# Patient Record
Sex: Female | Born: 1990 | Race: Black or African American | Hispanic: No | Marital: Single | Smoking: Never smoker
Health system: Southern US, Community
[De-identification: ages and names within clinical notes are randomized; demographics above are authoritative.]

## PROBLEM LIST (undated history)

## (undated) DIAGNOSIS — Z789 Other specified health status: Secondary | ICD-10-CM

## (undated) HISTORY — DX: Other specified health status: Z78.9

## (undated) HISTORY — PX: WISDOM TOOTH EXTRACTION: SHX21

---

## 2013-10-07 ENCOUNTER — Emergency Department (HOSPITAL_COMMUNITY)
Admission: EM | Admit: 2013-10-07 | Discharge: 2013-10-07 | Disposition: A | Discharge status: Home / Self Care | Payer: No Typology Code available for payment source | Attending: Emergency Medicine | Admitting: Emergency Medicine

## 2013-10-07 ENCOUNTER — Encounter (HOSPITAL_COMMUNITY): Payer: Self-pay | Admitting: Emergency Medicine

## 2013-10-07 DIAGNOSIS — S6990XA Unspecified injury of unspecified wrist, hand and finger(s), initial encounter: Secondary | ICD-10-CM | POA: Insufficient documentation

## 2013-10-07 DIAGNOSIS — Y9389 Activity, other specified: Secondary | ICD-10-CM | POA: Insufficient documentation

## 2013-10-07 DIAGNOSIS — S0990XA Unspecified injury of head, initial encounter: Secondary | ICD-10-CM | POA: Insufficient documentation

## 2013-10-07 DIAGNOSIS — IMO0002 Reserved for concepts with insufficient information to code with codable children: Secondary | ICD-10-CM | POA: Insufficient documentation

## 2013-10-07 DIAGNOSIS — S0993XA Unspecified injury of face, initial encounter: Secondary | ICD-10-CM | POA: Insufficient documentation

## 2013-10-07 DIAGNOSIS — Y92410 Unspecified street and highway as the place of occurrence of the external cause: Secondary | ICD-10-CM | POA: Insufficient documentation

## 2013-10-07 DIAGNOSIS — S59919A Unspecified injury of unspecified forearm, initial encounter: Secondary | ICD-10-CM

## 2013-10-07 DIAGNOSIS — S59909A Unspecified injury of unspecified elbow, initial encounter: Secondary | ICD-10-CM | POA: Insufficient documentation

## 2013-10-07 DIAGNOSIS — S199XXA Unspecified injury of neck, initial encounter: Principal | ICD-10-CM

## 2013-10-07 MED ORDER — NAPROXEN 500 MG PO TABS: 500.0000 mg | ORAL_TABLET | Freq: Two times a day (BID) | ORAL | Status: DC
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
## 2013-10-11 NOTE — ED Provider Notes (Signed)
Medical screening examination/treatment/procedure(s) were performed by non-physician practitioner and as supervising physician I was immediately available for consultation/collaboration.   EKG Interpretation None       Kristof Nadeem R. Araly Kaas, MD 10/11/13 0032 

## 2016-10-01 ENCOUNTER — Encounter (HOSPITAL_BASED_OUTPATIENT_CLINIC_OR_DEPARTMENT_OTHER): Payer: Self-pay

## 2016-10-01 ENCOUNTER — Emergency Department (HOSPITAL_BASED_OUTPATIENT_CLINIC_OR_DEPARTMENT_OTHER)
Admission: EM | Admit: 2016-10-01 | Discharge: 2016-10-01 | Disposition: A | Discharge status: Home / Self Care | Payer: No Typology Code available for payment source | Attending: Emergency Medicine | Admitting: Emergency Medicine

## 2016-10-01 DIAGNOSIS — S299XXA Unspecified injury of thorax, initial encounter: Secondary | ICD-10-CM | POA: Diagnosis present

## 2016-10-01 DIAGNOSIS — Y92410 Unspecified street and highway as the place of occurrence of the external cause: Secondary | ICD-10-CM | POA: Diagnosis not present

## 2016-10-01 DIAGNOSIS — Y999 Unspecified external cause status: Secondary | ICD-10-CM | POA: Diagnosis not present

## 2016-10-01 DIAGNOSIS — M546 Pain in thoracic spine: Secondary | ICD-10-CM | POA: Insufficient documentation

## 2016-10-01 DIAGNOSIS — Y9389 Activity, other specified: Secondary | ICD-10-CM | POA: Insufficient documentation

## 2016-10-01 DIAGNOSIS — R030 Elevated blood-pressure reading, without diagnosis of hypertension: Secondary | ICD-10-CM

## 2016-10-01 MED ORDER — BACLOFEN 10 MG PO TABS: 10.0000 mg | ORAL_TABLET | Freq: Three times a day (TID) | ORAL | 0 refills | Status: DC

## 2016-10-01 MED ORDER — MELOXICAM 15 MG PO TABS: 15.0000 mg | ORAL_TABLET | Freq: Every day | ORAL | 0 refills | Status: DC

## 2016-10-01 MED FILL — BACLOFEN 10 MG TABLET: 10 | 10 days supply | Qty: 30 | Fill #0

## 2016-10-01 MED FILL — MELOXICAM 15 MG TABLET: 15 | 10 days supply | Qty: 10 | Fill #0
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
## 2016-10-01 NOTE — Discharge Instructions (Signed)
Have your blood pressure rechecked in 1 week.  Follow up with a primary care doctor if it continues to be elevated. Return to the emergency department immediately if you develop any of the following symptoms: You have numbness, tingling, or weakness in the arms or legs. You develop severe headaches not relieved with medicine. You have severe neck pain, especially tenderness in the middle of the back of your neck. You have changes in bowel or bladder control. There is increasing pain in any area of the body. You have shortness of breath, light-headedness, dizziness, or fainting. You have chest pain. You feel sick to your stomach (nauseous), throw up (vomit), or sweat. You have increasing abdominal discomfort. There is blood in your urine, stool, or vomit. You have pain in your shoulder (shoulder strap areas). You feel your symptoms are getting worse.

## 2017-04-06 ENCOUNTER — Emergency Department (HOSPITAL_COMMUNITY): Payer: 59

## 2017-04-06 ENCOUNTER — Encounter (HOSPITAL_COMMUNITY): Payer: Self-pay

## 2017-04-06 DIAGNOSIS — R1031 Right lower quadrant pain: Secondary | ICD-10-CM | POA: Diagnosis not present

## 2017-04-06 DIAGNOSIS — M7662 Achilles tendinitis, left leg: Secondary | ICD-10-CM | POA: Diagnosis not present

## 2017-04-06 DIAGNOSIS — M25572 Pain in left ankle and joints of left foot: Secondary | ICD-10-CM | POA: Diagnosis not present

## 2017-04-06 LAB — COMPREHENSIVE METABOLIC PANEL
ALT: 15 U/L (ref 14–54)
AST: 20 U/L (ref 15–41)
Albumin: 3.7 g/dL (ref 3.5–5.0)
Alkaline Phosphatase: 82 U/L (ref 38–126)
Anion gap: 7 (ref 5–15)
BUN: 14 mg/dL (ref 6–20)
CHLORIDE: 104 mmol/L (ref 101–111)
CO2: 25 mmol/L (ref 22–32)
Calcium: 9.2 mg/dL (ref 8.9–10.3)
Creatinine, Ser: 0.86 mg/dL (ref 0.44–1.00)
GFR calc Af Amer: >60 mL/min (ref >60)
GFR calc non Af Amer: >60 mL/min (ref >60)
Glucose, Bld: 79 mg/dL (ref 65–99)
POTASSIUM: 3.8 mmol/L (ref 3.5–5.1)
SODIUM: 136 mmol/L (ref 135–145)
Total Bilirubin: 0.3 mg/dL (ref 0.3–1.2)
Total Protein: 7.5 g/dL (ref 6.5–8.1)

## 2017-04-06 LAB — URINALYSIS, ROUTINE W REFLEX MICROSCOPIC
Bilirubin Urine: NEGATIVE
GLUCOSE, UA: NEGATIVE mg/dL
Ketones, ur: NEGATIVE mg/dL
NITRITE: NEGATIVE
PH: 6 (ref 5.0–8.0)
Protein, ur: NEGATIVE mg/dL
SPECIFIC GRAVITY, URINE: 1.021 (ref 1.005–1.030)

## 2017-04-06 LAB — CBC
HCT: 33.9 % — ABNORMAL LOW (ref 36.0–46.0)
Hemoglobin: 10.9 g/dL — ABNORMAL LOW (ref 12.0–15.0)
MCH: 26.7 pg (ref 26.0–34.0)
MCHC: 32.2 g/dL (ref 30.0–36.0)
MCV: 83.1 fL (ref 78.0–100.0)
Platelets: 339 10*3/uL (ref 150–400)
RBC: 4.08 MIL/uL (ref 3.87–5.11)
RDW: 13.7 % (ref 11.5–15.5)
WBC: 8.8 10*3/uL (ref 4.0–10.5)

## 2017-04-06 LAB — LIPASE, BLOOD: LIPASE: 26 U/L (ref 11–51)

## 2017-04-06 NOTE — ED Triage Notes (Signed)
Pt states that she has been having abd pain for the past six months, RUQ, sometimes nausea with loss of appetite. Also c/o of L foot pain that started on Saturday without any injury, no swelling noted.

## 2017-04-07 ENCOUNTER — Emergency Department (HOSPITAL_COMMUNITY)
Admission: EM | Admit: 2017-04-07 | Discharge: 2017-04-07 | Disposition: A | Discharge status: Home / Self Care | Payer: 59 | Attending: Emergency Medicine | Admitting: Emergency Medicine

## 2017-04-07 DIAGNOSIS — M7662 Achilles tendinitis, left leg: Secondary | ICD-10-CM

## 2017-04-07 DIAGNOSIS — R1031 Right lower quadrant pain: Secondary | ICD-10-CM

## 2017-04-07 MED ORDER — IBUPROFEN 600 MG PO TABS: 600.0000 mg | ORAL_TABLET | Freq: Four times a day (QID) | ORAL | 0 refills | Status: DC | PRN

## 2017-04-07 NOTE — Discharge Instructions (Signed)
Please follow up with gastroenterology  Rest - please stay off ankle as much as possible Ice - ice for 20 minutes at a time, several times a day Ibuprofen - take with food. Take up to 3-4 times daily

## 2017-04-07 NOTE — ED Provider Notes (Signed)
[facility]   CSN: [id number] Arrival date & time: 04/06/17  1833     History   Chief Complaint Chief Complaint  Patient presents with  . Abdominal Pain  . Ankle Pain    HPI Catherine Mccoy is a 26 y.o. female who presents with abdominal pain and left ankle pain.  No significant past medical history.  Patient states that she has had intermittent right lower quadrant pain for the past year.  It is worse when she is lifting heavy items.  She states she works in [employer] service and is on her feet for most of the day and this heavy objects frequently.  The pain gets better when she lies down.  She has been seen by previous providers before and they told her it was a muscle strain.  She was prescribed medicine which did not help the pain.  Over the past week the pain is worse that is why she has come to the ED today.  She denies any fever, chills, chest pain, shortness of breath, nausea, vomiting, diarrhea, constipation, urinary symptoms, vaginal bleeding or discharge.  No prior abdominal surgeries or colonoscopy.  She reports associated loss of appetite.  [place] [facility] [place]  Additionally she complains of left sided ankle pain.  She has had this for the past 2 days.  She denies any known injury.  The pain is over the posterior ankle.  It is constant and worse with walking.  She is ambulatory without any difficulty and does not have any swelling.  She has not tried any medicines prior to arrival.  HPI  History reviewed. No pertinent past medical history.  There are no active problems to display for this patient.   History reviewed. No pertinent surgical history.  OB History    No data available       Home Medications    Prior to Admission medications   Medication Sig Start Date End Date Taking? Authorizing Provider  baclofen (LIORESAL) 10 MG tablet Take 1 tablet (10 mg total) by mouth 3 (three) times daily. Patient not  taking: Reported on 04/06/2017 10/01/16   Arthor CaptainHarris, Abigail, PA-C  ibuprofen (ADVIL,MOTRIN) 600 MG tablet Take 1 tablet (600 mg total) every 6 (six) hours as needed by mouth. 04/07/17   Bethel BornGekas, Damon Baisch Marie, PA-C  meloxicam (MOBIC) 15 MG tablet Take 1 tablet (15 mg total) by mouth daily. Patient not taking: Reported on 04/06/2017 10/01/16   Arthor CaptainHarris, Abigail, PA-C    Family History No family history on file.  Social History Social History   Tobacco Use  . Smoking status: Never Smoker  . Smokeless tobacco: Never Used  Substance Use Topics  . Alcohol use: No  . Drug use: Not on file     Allergies   Patient has no known allergies.   Review of Systems Review of Systems  Constitutional: Positive for appetite change. Negative for chills and fever.  Respiratory: Negative for shortness of breath.   Cardiovascular: Negative for chest pain.  Gastrointestinal: Positive for abdominal pain. Negative for diarrhea, nausea and vomiting.  Genitourinary: Negative for dysuria.  Musculoskeletal: Positive for arthralgias. Negative for gait problem and joint swelling.  Neurological: Negative for weakness and numbness.  All other systems reviewed and are negative.    Physical Exam Updated Vital Signs BP 127/77 (BP Location: Right Arm)   Pulse 83   Temp 98.5 F (36.9 C) (Oral)   Resp 18   Ht 5\' 4"  (1.626  m)   Wt 89.8 kg (198 lb)   LMP 04/02/2017   SpO2 100%   BMI 33.99 kg/m   Physical Exam  Constitutional: She is oriented to person, place, and time. She appears well-developed and well-nourished. No distress.  HENT:  Head: Normocephalic and atraumatic.  Eyes: Conjunctivae are normal. Pupils are equal, round, and reactive to light. Right eye exhibits no discharge. Left eye exhibits no discharge. No scleral icterus.  Neck: Normal range of motion.  Cardiovascular: Normal rate and regular rhythm. Exam reveals no gallop and no friction rub.  No murmur heard. Pulmonary/Chest: Effort normal and breath  sounds normal. No stridor. No respiratory distress. She has no wheezes. She has no rales. She exhibits no tenderness.  Abdominal: Soft. Bowel sounds are normal. She exhibits no distension and no mass. There is no tenderness. There is no rebound and no guarding. No hernia.  No tenderness however patient points to right lower quadrant and states this is where it hurts when she has pain  Musculoskeletal:  Left ankle: No obvious swelling, deformity, or warmth. Tenderness to palpation of Achilles tendon. FROM. N/V intact.   Neurological: She is alert and oriented to person, place, and time.  Skin: Skin is warm and dry.  Psychiatric: She has a normal mood and affect. Her behavior is normal.  Nursing note and vitals reviewed.    ED Treatments / Results  Labs (all labs ordered are listed, but only abnormal results are displayed) Labs Reviewed  CBC - Abnormal; Notable for the following components:      Result Value   Hemoglobin 10.9 (*)    HCT 33.9 (*)    All other components within normal limits  URINALYSIS, ROUTINE W REFLEX MICROSCOPIC - Abnormal; Notable for the following components:   APPearance HAZY (*)    Hgb urine dipstick SMALL (*)    Leukocytes, UA LARGE (*)    Bacteria, UA MANY (*)    Squamous Epithelial / LPF 0-5 (*)    All other components within normal limits  LIPASE, BLOOD  COMPREHENSIVE METABOLIC PANEL    EKG  EKG Interpretation None       Radiology Dg Ankle Complete Left  Result Date: 04/06/2017 CLINICAL DATA:  Medial left ankle pain for 2 days. EXAM: LEFT ANKLE COMPLETE - 3+ VIEW COMPARISON:  None. FINDINGS: There is no evidence of fracture, dislocation, or joint effusion. There is no evidence of arthropathy or other focal bone abnormality. Soft tissues are unremarkable. IMPRESSION: Negative. Electronically Signed   By: Kennith Center M.D.   On: 04/06/2017 20:45    Procedures Procedures (including critical care time)  Medications Ordered in ED Medications - No  data to display   Initial Impression / Assessment and Plan / ED Course  I have reviewed the triage vital signs and the nursing notes.  Pertinent labs & imaging results that were available during my care of the patient were reviewed by me and considered in my medical decision making (see chart for details).  26 year old female with nonspecific right lower quadrant pain which has been ongoing for over a year.  Vital signs are normal.  Abdomen is soft and nontender.  Labs are remarkable for mild anemia.  Urinalysis shows small hemoglobin, leukocytes, many bacteria, too numerous to count white blood cells.  She denies any urinary symptoms at this time so we will hold off on any treatment.  Unclear etiology of pain.  Possibly a hernia or muscle strain since she has pain with lifting and  it is better with lying down.  She was given a referral to GI for ongoing pain.  Left ankle pain is consistent with Achilles tendinitis.  Advised rest, ice, NSAIDs, supportive shoes.  Work note was given and she was advised to follow-up with her doctor.  Final Clinical Impressions(s) / ED Diagnoses   Final diagnoses:  Right lower quadrant abdominal pain  Achilles tendinitis of left lower extremity    ED Discharge Orders        Ordered    ibuprofen (ADVIL,MOTRIN) 600 MG tablet  Every 6 hours PRN     04/07/17 0139       Bethel Born, PA-C 04/07/17 0445    Ward, Layla Maw, DO 04/07/17 [phone]

## 2017-04-20 ENCOUNTER — Other Ambulatory Visit: Payer: Self-pay | Admitting: Gastroenterology

## 2017-04-20 DIAGNOSIS — R1031 Right lower quadrant pain: Secondary | ICD-10-CM

## 2017-04-30 ENCOUNTER — Ambulatory Visit
Admission: RE | Admit: 2017-04-30 | Discharge: 2017-04-30 | Disposition: A | Discharge status: Home / Self Care | Payer: 59 | Source: Ambulatory Visit | Attending: Gastroenterology | Admitting: Gastroenterology

## 2017-04-30 DIAGNOSIS — R1031 Right lower quadrant pain: Secondary | ICD-10-CM

## 2017-04-30 MED ORDER — IOPAMIDOL (ISOVUE-300) INJECTION 61%
100.0000 mL | Freq: Once | INTRAVENOUS | Status: AC | PRN
  Administered 2017-04-30: 100 mL via INTRAVENOUS

## 2017-12-18 ENCOUNTER — Ambulatory Visit (INDEPENDENT_AMBULATORY_CARE_PROVIDER_SITE_OTHER): Payer: Managed Care, Other (non HMO) | Admitting: Family Medicine

## 2017-12-18 ENCOUNTER — Encounter: Payer: Self-pay | Admitting: Family Medicine

## 2017-12-18 ENCOUNTER — Other Ambulatory Visit: Payer: Self-pay

## 2017-12-18 VITALS — BP 102/80 | HR 89 | Temp 98.9°F | Ht 64.5 in | Wt 252.0 lb

## 2017-12-18 DIAGNOSIS — Z3169 Encounter for other general counseling and advice on procreation: Secondary | ICD-10-CM

## 2017-12-18 DIAGNOSIS — Z13220 Encounter for screening for lipoid disorders: Secondary | ICD-10-CM | POA: Diagnosis not present

## 2017-12-18 DIAGNOSIS — Z23 Encounter for immunization: Secondary | ICD-10-CM

## 2017-12-18 DIAGNOSIS — Z131 Encounter for screening for diabetes mellitus: Secondary | ICD-10-CM | POA: Diagnosis not present

## 2017-12-18 DIAGNOSIS — Z13228 Encounter for screening for other metabolic disorders: Secondary | ICD-10-CM

## 2017-12-18 DIAGNOSIS — Z0000 Encounter for general adult medical examination without abnormal findings: Secondary | ICD-10-CM

## 2017-12-18 DIAGNOSIS — Z9189 Other specified personal risk factors, not elsewhere classified: Secondary | ICD-10-CM

## 2017-12-18 DIAGNOSIS — Z1329 Encounter for screening for other suspected endocrine disorder: Secondary | ICD-10-CM

## 2017-12-18 NOTE — Patient Instructions (Addendum)
Hep B #3 and HPV #2 due in 2 months HPV #3 due in 6 months   IF you received an x-ray today, you will receive an invoice from [facility]. Please contact [facility] at [phone] with questions or concerns regarding your invoice.   IF you received labwork today, you will receive an invoice from [place]. Please contact [facility] at [phone] with questions or concerns regarding your invoice.   Our billing staff will not be able to assist you with questions regarding bills from these companies.  You will be contacted with the lab results as soon as they are available. The fastest way to get your results is to activate your My Chart account. Instructions are located on the last page of this paperwork. If you have not heard from [place] regarding the results in 2 weeks, please contact this office.     Preventive Care 18-39 Years, Female Preventive care refers to lifestyle choices and visits with your health care provider that can promote health and wellness. What does preventive care include?  A yearly physical exam. This is also called an annual well check.  Dental exams once or twice a year.  Routine eye exams. Ask your health care provider how often you should have your eyes checked.  Personal lifestyle choices, including: ? Daily care of your teeth and gums. ? Regular physical activity. ? Eating a healthy diet. ? Avoiding tobacco and drug use. ? Limiting alcohol use. ? Practicing safe sex. ? Taking vitamin and mineral supplements as recommended by your health care provider. What happens during an annual well check? The services and screenings done by your health care provider during your annual well check will depend on your age, overall health, lifestyle risk factors, and family history of disease. Counseling Your health care provider may ask you questions about your:  Alcohol use.  Tobacco use.  Drug use.  Emotional well-being.  Home and  relationship well-being.  Sexual activity.  Eating habits.  Work and work Statistician.  Method of birth control.  Menstrual cycle.  Pregnancy history.  Screening You may have the following tests or measurements:  Height, weight, and BMI.  Diabetes screening. This is done by checking your blood sugar (glucose) after you have not eaten for a while (fasting).  Blood pressure.  Lipid and cholesterol levels. These may be checked every 5 years starting at age 51.  Skin check.  Hepatitis C blood test.  Hepatitis B blood test.  Sexually transmitted disease (STD) testing.  BRCA-related cancer screening. This may be done if you have a family history of breast, ovarian, tubal, or peritoneal cancers.  Pelvic exam and Pap test. This may be done every 3 years starting at age 25. Starting at age 80, this may be done every 5 years if you have a Pap test in combination with an HPV test.  Discuss your test results, treatment options, and if necessary, the need for more tests with your health care provider. Vaccines Your health care provider may recommend certain vaccines, such as:  Influenza vaccine. This is recommended every year.  Tetanus, diphtheria, and acellular pertussis (Tdap, Td) vaccine. You may need a Td booster every 10 years.  Varicella vaccine. You may need this if you have not been vaccinated.  HPV vaccine. If you are 53 or younger, you may need three doses over 6 months.  Measles, mumps, and rubella (MMR) vaccine. You may need at least one dose of MMR. You may also need a second dose.  Pneumococcal 13-valent conjugate (PCV13) vaccine. You may need this if you have certain conditions and were not previously vaccinated.  Pneumococcal polysaccharide (PPSV23) vaccine. You may need one or two doses if you smoke cigarettes or if you have certain conditions.  Meningococcal vaccine. One dose is recommended if you are age 80-21 years and a first-year college student living  in a residence hall, or if you have one of several medical conditions. You may also need additional booster doses.  Hepatitis A vaccine. You may need this if you have certain conditions or if you travel or work in places where you may be exposed to hepatitis A.  Hepatitis B vaccine. You may need this if you have certain conditions or if you travel or work in places where you may be exposed to hepatitis B.  Haemophilus influenzae type b (Hib) vaccine. You may need this if you have certain risk factors.  Talk to your health care provider about which screenings and vaccines you need and how often you need them. This information is not intended to replace advice given to you by your health care provider. Make sure you discuss any questions you have with your health care provider. Document Released: 07/15/2001 Document Revised: 02/06/2016 Document Reviewed: 03/20/2015 Elsevier Interactive Patient Education  [employer].

## 2017-12-18 NOTE — Progress Notes (Signed)
7/19/20194:25 PM  Catherine Mccoy 10-Apr-1991, 27 y.o. female [id number]  Chief Complaint  Patient presents with  . New Patient (Initial Visit)    having no symptoms today, just looking for PCP    HPI:   Patient is a 27 y.o. 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 No     Depression screen PHQ 2/9 12/18/2017  Decreased Interest 0  Down, Depressed, Hopeless 0  PHQ - 2 Score 0    No Known Allergies  Prior to Admission medications   Not on File    History reviewed. No pertinent past medical history.  History reviewed. No pertinent surgical history.  Social History   Tobacco Use  . Smoking status: Never Smoker  . Smokeless tobacco: Never Used  Substance Use Topics  . Alcohol use: No    Family History  Problem Relation Age of Onset  . Hyperlipidemia Mother   . Healthy Mother   . Hyperlipidemia Father   . Healthy Father   . Healthy Sister   . Healthy Brother     Review of Systems  Constitutional: Negative for chills and fever.  Respiratory: Negative for cough and shortness of breath.   Cardiovascular: Negative for chest pain, palpitations and leg swelling.  Gastrointestinal: Negative for abdominal pain, nausea and vomiting.  Genitourinary: Negative for dysuria and hematuria.       Neg breast lumps or nipple discharge Neg vaginal discharge, pelvic pain, dyspareunia, abnormal vaginal bleeding  Musculoskeletal: Negative for falls and  myalgias.  Neurological: Negative for dizziness and headaches.  Psychiatric/Behavioral: Negative for depression. The patient is not nervous/anxious and does not have insomnia.   All other systems reviewed and are negative.    OBJECTIVE:  Blood pressure 102/80, pulse 89, temperature 98.9 F (37.2 C), temperature source Oral, height 5' 4.5" (1.638 m), weight 252 lb (114.3 kg), last menstrual period 11/23/2017, SpO2 100 %. Body mass index is 42.59 kg/m.  Physical Exam  Constitutional: She is oriented to person, place, and time. She appears well-developed and well-nourished.  HENT:  Head: Normocephalic and atraumatic.  Right Ear: Hearing, tympanic membrane, external ear and ear canal normal.  Left Ear: Hearing, tympanic membrane, external ear and ear canal normal.  Mouth/Throat: Oropharynx is clear and moist.  Eyes: Pupils are equal, round, and reactive to light. Conjunctivae and EOM are normal.  Neck: Neck supple. No thyromegaly present.  Cardiovascular: Normal rate, regular rhythm, normal heart sounds and intact distal pulses. Exam reveals no gallop and no friction rub.  No murmur heard. Pulmonary/Chest: Effort normal and breath sounds normal. She has no wheezes. She has no rales.  Abdominal: Soft. Bowel sounds are normal. She exhibits no distension and no mass. There is no tenderness.  Musculoskeletal: Normal range of motion. She exhibits no edema.  Lymphadenopathy:    She has no cervical adenopathy.  Neurological: She is alert and oriented to person, place, and time. She has normal reflexes. No cranial nerve deficit. Gait normal.  Skin: Skin  is warm and dry.  Psychiatric: She has a normal mood and affect.  Nursing note and vitals reviewed.   ASSESSMENT and PLAN  1. Annual physical exam Routine HCM labs ordered. HCM reviewed/discussed. Anticipatory guidance regarding healthy weight, lifestyle and choices given.   2. Need for vaccination Hep B #3 and HPV #2 due in 2  months HPV #3 due in 6 months  - HPV 9-valent vaccine,Recombinat - Hepatitis B vaccine adult IM  3. Screening for lipid disorders - Lipid panel  4. Screening for diabetes mellitus - Hemoglobin A1c  5. Screening for metabolic disorder - QMV78+IONG  6. Screening for thyroid disorder - TSH  7. Pre-conception counseling 8. At risk for fertility problems - Ambulatory referral to Obstetrics / Gynecology   Return for after fertility eval. will need pap if not done by specialist    Rutherford Guys, MD Primary Care at [facility], [place] [address] Ph.  [phone] Fax [phone]

## 2017-12-19 LAB — LIPID PANEL
Chol/HDL Ratio: 4.1 ratio (ref 0.0–4.4)
Cholesterol, Total: 171 mg/dL (ref 100–199)
HDL: 42 mg/dL (ref >39)
LDL Calculated: 117 mg/dL — ABNORMAL HIGH (ref 0–99)
Triglycerides: 59 mg/dL (ref 0–149)
VLDL Cholesterol Cal: 12 mg/dL (ref 5–40)

## 2017-12-19 LAB — CMP14+EGFR
ALT: 16 IU/L (ref 0–32)
AST: 17 IU/L (ref 0–40)
Albumin/Globulin Ratio: 1.2 (ref 1.2–2.2)
Albumin: 4.2 g/dL (ref 3.5–5.5)
Alkaline Phosphatase: 94 IU/L (ref 39–117)
BUN/Creatinine Ratio: 13 (ref 9–23)
BUN: 10 mg/dL (ref 6–20)
Bilirubin Total: <0.2 mg/dL (ref 0.0–1.2)
CO2: 24 mmol/L (ref 20–29)
Calcium: 9.4 mg/dL (ref 8.7–10.2)
Chloride: 102 mmol/L (ref 96–106)
Creatinine, Ser: 0.8 mg/dL (ref 0.57–1.00)
GFR calc Af Amer: 116 mL/min/{1.73_m2} (ref >59)
GFR calc non Af Amer: 101 mL/min/{1.73_m2} (ref >59)
Globulin, Total: 3.4 g/dL (ref 1.5–4.5)
Glucose: 79 mg/dL (ref 65–99)
Potassium: 4.4 mmol/L (ref 3.5–5.2)
Sodium: 138 mmol/L (ref 134–144)
Total Protein: 7.6 g/dL (ref 6.0–8.5)

## 2017-12-19 LAB — TSH: TSH: 2.5 u[IU]/mL (ref 0.450–4.500)

## 2017-12-19 LAB — HEMOGLOBIN A1C
Est. average glucose Bld gHb Est-mCnc: 114 mg/dL
Hgb A1c MFr Bld: 5.6 % (ref 4.8–5.6)

## 2018-12-21 ENCOUNTER — Encounter: Payer: Self-pay | Admitting: Family Medicine

## 2018-12-24 ENCOUNTER — Telehealth: Payer: BLUE CROSS/BLUE SHIELD | Admitting: Family

## 2018-12-24 DIAGNOSIS — B373 Candidiasis of vulva and vagina: Secondary | ICD-10-CM

## 2018-12-24 DIAGNOSIS — B3731 Acute candidiasis of vulva and vagina: Secondary | ICD-10-CM

## 2018-12-24 MED ORDER — FLUCONAZOLE 150 MG PO TABS: 150.0000 mg | ORAL_TABLET | Freq: Once | ORAL | 0 refills | Status: AC

## 2018-12-24 NOTE — Progress Notes (Signed)
Greater than 5 minutes, yet less than 10 minutes of time have been spent researching, coordinating, and implementing care for this patient today.  Thank you for the details you included in the comment boxes. Those details are very helpful in determining the best course of treatment for you and help us to provide the best care.  We are sorry that you are not feeling well. Here is how we plan to help! Based on what you shared with me it looks like you: May have a yeast vaginosis  Vaginosis is an inflammation of the vagina that can result in discharge, itching and pain. The cause is usually a change in the normal balance of vaginal bacteria or an infection. Vaginosis can also result from reduced estrogen levels after menopause.  The most common causes of vaginosis are:   Bacterial vaginosis which results from an overgrowth of one on several organisms that are normally present in your vagina.   Yeast infections which are caused by a naturally occurring fungus called candida.   Vaginal atrophy (atrophic vaginosis) which results from the thinning of the vagina from reduced estrogen levels after menopause.   Trichomoniasis which is caused by a parasite and is commonly transmitted by sexual intercourse.  Factors that increase your risk of developing vaginosis include: . Medications, such as antibiotics and steroids . Uncontrolled diabetes . Use of hygiene products such as bubble bath, vaginal spray or vaginal deodorant . Douching . Wearing damp or tight-fitting clothing . Using an intrauterine device (IUD) for birth control . Hormonal changes, such as those associated with pregnancy, birth control pills or menopause . Sexual activity . Having a sexually transmitted infection  Your treatment plan is A single Diflucan (fluconazole) 150mg tablet once.  I have electronically sent this prescription into the pharmacy that you have chosen.  Be sure to take all of the medication as directed. Stop  taking any medication if you develop a rash, tongue swelling or shortness of breath. Mothers who are breast feeding should consider pumping and discarding their breast milk while on these antibiotics. However, there is no consensus that infant exposure at these doses would be harmful.  Remember that medication creams can weaken latex condoms. .   HOME CARE:  Good hygiene may prevent some types of vaginosis from recurring and may relieve some symptoms:  . Avoid baths, hot tubs and whirlpool spas. Rinse soap from your outer genital area after a shower, and dry the area well to prevent irritation. Don't use scented or harsh soaps, such as those with deodorant or antibacterial action. . Avoid irritants. These include scented tampons and pads. . Wipe from front to back after using the toilet. Doing so avoids spreading fecal bacteria to your vagina.  Other things that may help prevent vaginosis include:  . Don't douche. Your vagina doesn't require cleansing other than normal bathing. Repetitive douching disrupts the normal organisms that reside in the vagina and can actually increase your risk of vaginal infection. Douching won't clear up a vaginal infection. . Use a latex condom. Both female and female latex condoms may help you avoid infections spread by sexual contact. . Wear cotton underwear. Also wear pantyhose with a cotton crotch. If you feel comfortable without it, skip wearing underwear to bed. Yeast thrives in moist environments Your symptoms should improve in the next day or two.  GET HELP RIGHT AWAY IF:  . You have pain in your lower abdomen ( pelvic area or over your ovaries) . You develop nausea   or vomiting . You develop a fever . Your discharge changes or worsens . You have persistent pain with intercourse . You develop shortness of breath, a rapid pulse, or you faint.  These symptoms could be signs of problems or infections that need to be evaluated by a medical provider  now.  MAKE SURE YOU    Understand these instructions.  Will watch your condition.  Will get help right away if you are not doing well or get worse.  Your e-visit answers were reviewed by a board certified advanced clinical practitioner to complete your personal care plan. Depending upon the condition, your plan could have included both over the counter or prescription medications. Please review your pharmacy choice to make sure that you have choses a pharmacy that is open for you to pick up any needed prescription, Your safety is important to us. If you have drug allergies check your prescription carefully.   You can use MyChart to ask questions about today's visit, request a non-urgent call back, or ask for a work or school excuse for 24 hours related to this e-Visit. If it has been greater than 24 hours you will need to follow up with your provider, or enter a new e-Visit to address those concerns. You will get a MyChart message within the next two days asking about your experience. I hope that your e-visit has been valuable and will speed your recovery.  

## 2019-01-09 DIAGNOSIS — Z1159 Encounter for screening for other viral diseases: Secondary | ICD-10-CM | POA: Diagnosis not present

## 2019-01-10 ENCOUNTER — Telehealth: Payer: Self-pay | Admitting: *Deleted

## 2019-01-10 ENCOUNTER — Telehealth: Payer: BLUE CROSS/BLUE SHIELD | Admitting: Emergency Medicine

## 2019-01-10 ENCOUNTER — Encounter: Payer: Self-pay | Admitting: Emergency Medicine

## 2019-01-10 DIAGNOSIS — Z20828 Contact with and (suspected) exposure to other viral communicable diseases: Secondary | ICD-10-CM

## 2019-01-10 DIAGNOSIS — Z20822 Contact with and (suspected) exposure to covid-19: Secondary | ICD-10-CM

## 2019-01-10 NOTE — Progress Notes (Signed)
Telemedicine Encounter- SOAP NOTE Established Patient  This telephone encounter was conducted with the patient's (or proxy's) verbal consent via audio telecommunications: yes/no: Yes Patient was instructed to have this encounter in a suitably private space; and to only have persons present to whom they give permission to participate. In addition, patient identity was confirmed by use of name plus two identifiers (DOB and address).  I discussed the limitations, risks, security and privacy concerns of performing an evaluation and management service by telephone and the availability of in person appointments. I also discussed with the patient that there may be a patient responsible charge related to this service. The patient expressed understanding and agreed to proceed.  I spent a total of TIME; 0 MIN TO 60 MIN: 5 minutes talking with the patient or their proxy.  No chief complaint on file. COVID exposure  Subjective   Catherine Mccoy is a 28 y.o. female established patient. Telephone visit today for possible COVID testing.  Patient states that she was exposed and wanted to be tested.  However she was already tested yesterday and wanted to cancel this appointment.  She tested negative.  Asymptomatic.  Has no other concerns or questions today.  HPI   There are no active problems to display for this patient.   History reviewed. No pertinent past medical history.  No current outpatient medications on file.   No current facility-administered medications for this visit.     No Known Allergies  Social History   Socioeconomic History  . Marital status: Single    Spouse name: Not on file  . Number of children: Not on file  . Years of education: Not on file  . Highest education level: Not on file  Occupational History  . Not on file  Social Needs  . Financial resource strain: Not on file  . Food insecurity    Worry: Not on file    Inability: Not on file  . Transportation needs   Medical: Not on file    Non-medical: Not on file  Tobacco Use  . Smoking status: Never Smoker  . Smokeless tobacco: Never Used  Substance and Sexual Activity  . Alcohol use: No  . Drug use: Never  . Sexual activity: Never    Birth control/protection: None  Lifestyle  . Physical activity    Days per week: Not on file    Minutes per session: Not on file  . Stress: Not on file  Relationships  . Social Musicianconnections    Talks on phone: Not on file    Gets together: Not on file    Attends religious service: Not on file    Active member of club or organization: Not on file    Attends meetings of clubs or organizations: Not on file    Relationship status: Not on file  . Intimate partner violence    Fear of current or ex partner: Not on file    Emotionally abused: Not on file    Physically abused: Not on file    Forced sexual activity: Not on file  Other Topics Concern  . Not on file  Social History Narrative  . Not on file    ROS  Objective  Alert and oriented x3 in no apparent respiratory distress Vitals as reported by the patient: There were no vitals filed for this visit.  There are no diagnoses linked to this encounter. Diagnoses and all orders for this visit:  Exposure to Covid-19 Virus  I discussed the assessment and treatment plan with the patient. The patient was provided an opportunity to ask questions and all were answered. The patient agreed with the plan and demonstrated an understanding of the instructions.   The patient was advised to call back or seek an in-person evaluation if the symptoms worsen or if the condition fails to improve as anticipated.  I provided 5 minutes of non-face-to-face time during this encounter.  Horald Pollen, MD  Primary Care at [facility]

## 2019-01-10 NOTE — Telephone Encounter (Signed)
Called patient to triage for appointment. Patient answered the phone, her voice was very low and I identified myself as calling from Primary Care at [facility]. The patient hung up and I called back without her answering, I left message in mobile voice mail. I called patient again at 5:01, I left message in mobile voice mail again.

## 2019-01-24 ENCOUNTER — Ambulatory Visit (INDEPENDENT_AMBULATORY_CARE_PROVIDER_SITE_OTHER): Payer: BLUE CROSS/BLUE SHIELD | Admitting: Family Medicine

## 2019-01-24 ENCOUNTER — Other Ambulatory Visit: Payer: Self-pay

## 2019-01-24 ENCOUNTER — Encounter: Payer: Self-pay | Admitting: Family Medicine

## 2019-01-24 ENCOUNTER — Encounter: Payer: BLUE CROSS/BLUE SHIELD | Admitting: Family Medicine

## 2019-01-24 VITALS — BP 121/82 | HR 98 | Temp 98.1°F | Ht 64.5 in | Wt 263.2 lb

## 2019-01-24 DIAGNOSIS — Z13220 Encounter for screening for lipoid disorders: Secondary | ICD-10-CM

## 2019-01-24 DIAGNOSIS — Z0000 Encounter for general adult medical examination without abnormal findings: Secondary | ICD-10-CM

## 2019-01-24 DIAGNOSIS — Z23 Encounter for immunization: Secondary | ICD-10-CM | POA: Diagnosis not present

## 2019-01-24 DIAGNOSIS — Z131 Encounter for screening for diabetes mellitus: Secondary | ICD-10-CM

## 2019-01-24 DIAGNOSIS — Z114 Encounter for screening for human immunodeficiency virus [HIV]: Secondary | ICD-10-CM | POA: Diagnosis not present

## 2019-01-24 NOTE — Progress Notes (Signed)
8/24/20204:52 PM  Catherine Mccoy 02/10/1991, 28 y.o., female [id number]  Chief Complaint  Patient presents with  . Gynecologic Exam  . Annual Exam    HPI:   Patient is a 28 y.o. female who presents today for CPE  G&Ps: 0 Pap: none, has never been sexually active STD: none BC : none Menses: regular, last about 4-5 days, heavy first 2, not painful Mammogram: none FHX breast/ovarian cancer: none FHx colon cancer: none Exercise/diet: trying to exercise 3-4 x week  Will be getting married next summer  Most Recent Immunizations  Administered Date(s) Administered  . HPV 9-valent 12/18/2017  . Hepatitis B, adult 12/18/2017    Depression screen [facility] 2/9 01/24/2019 01/24/2019 12/18/2017  Decreased Interest 0 0 0  Down, Depressed, Hopeless 0 0 0  PHQ - 2 Score 0 0 0    Fall Risk  01/24/2019 01/24/2019 12/18/2017  Falls in the past year? 0 0 No  Number falls in past yr: 0 0 -  Injury with Fall? 0 0 -     No Known Allergies  Prior to Admission medications   Not on File    History reviewed. No pertinent past medical history.  History reviewed. No pertinent surgical history.  Social History   Tobacco Use  . Smoking status: Never Smoker  . Smokeless tobacco: Never Used  Substance Use Topics  . Alcohol use: No    Family History  Problem Relation Age of Onset  . Hyperlipidemia Mother   . Healthy Mother   . Hyperlipidemia Father   . Healthy Father   . Healthy Sister   . Healthy Brother     Review of Systems  Constitutional: Negative for chills and fever.  Respiratory: Negative for cough and shortness of breath.   Cardiovascular: Negative for chest pain, palpitations and leg swelling.  Gastrointestinal: Negative for abdominal pain, nausea and vomiting.  All other systems reviewed and are negative.    OBJECTIVE:  Today's Vitals   01/24/19 1637  BP: 121/82  Pulse: 98  Temp: 98.1 F (36.7 C)  TempSrc: Oral  SpO2: 100%  Weight: 263 lb 3.2 oz (119.4 kg)   Height: 5' 4.5" (1.638 m)   Body mass index is 44.48 kg/m.   Physical Exam Vitals signs and nursing note reviewed. Exam conducted with a chaperone present.  Constitutional:      Appearance: She is well-developed.  HENT:     Head: Normocephalic and atraumatic.     Right Ear: Hearing, tympanic membrane, ear canal and external ear normal.     Left Ear: Hearing, tympanic membrane, ear canal and external ear normal.     Mouth/Throat:     Mouth: Mucous membranes are moist.     Pharynx: No oropharyngeal exudate or posterior oropharyngeal erythema.  Eyes:     Extraocular Movements: Extraocular movements intact.     Conjunctiva/sclera: Conjunctivae normal.     Pupils: Pupils are equal, round, and reactive to light.  Neck:     Musculoskeletal: Neck supple.     Thyroid: No thyromegaly.  Cardiovascular:     Rate and Rhythm: Normal rate and regular rhythm.     Heart sounds: Normal heart sounds. No murmur. No friction rub. No gallop.   Pulmonary:     Effort: Pulmonary effort is normal.     Breath sounds: Normal breath sounds. No wheezing, rhonchi or rales.  Chest:     Breasts:        Right: No mass, nipple discharge, skin change or  tenderness.        Left: No mass, nipple discharge, skin change or tenderness.  Abdominal:     General: Bowel sounds are normal. There is no distension.     Palpations: Abdomen is soft. There is no hepatomegaly, splenomegaly or mass.     Tenderness: There is no abdominal tenderness.  Musculoskeletal: Normal range of motion.     Right lower leg: No edema.     Left lower leg: No edema.  Lymphadenopathy:     Cervical: No cervical adenopathy.     Upper Body:     Right upper body: No supraclavicular, axillary or pectoral adenopathy.     Left upper body: No supraclavicular, axillary or pectoral adenopathy.  Skin:    General: Skin is warm and dry.  Neurological:     Mental Status: She is alert and oriented to person, place, and time.     Cranial Nerves: No  cranial nerve deficit.     Gait: Gait normal.     Deep Tendon Reflexes: Reflexes are normal and symmetric.  Psychiatric:        Mood and Affect: Mood normal.        Behavior: Behavior normal.     No results found for this or any previous visit (from the past 24 hour(s)).  No results found.   ASSESSMENT and PLAN  1. Annual physical exam Routine HCM labs ordered. HCM reviewed/discussed. Anticipatory guidance regarding healthy weight, lifestyle and choices given.   2. Screening for HIV (human immunodeficiency virus)  3. Need for vaccination - Td vaccine greater than or equal to 7yo preservative free IM  4. Screening for lipid disorders - Lipid panel  5. Screening for diabetes mellitus - Hemoglobin A1c  Other orders - Hepatitis B vaccine adult IM - HPV 9-valent vaccine,Recombinat  Return in about 1 year (around 01/24/2020).    Myles LippsIrma M Santiago, MD Primary Care at [facility] [place] [address] [place], [place] [address] Ph.  [phone] Fax [phone]

## 2019-01-24 NOTE — Patient Instructions (Addendum)
Last HPV vaccine due in 6 months   Preventive Care 24-29 Years Old, Female Preventive care refers to visits with your health care provider and lifestyle choices that can promote health and wellness. This includes:  A yearly physical exam. This may also be called an annual well check.  Regular dental visits and eye exams.  Immunizations.  Screening for certain conditions.  Healthy lifestyle choices, such as eating a healthy diet, getting regular exercise, not using drugs or products that contain nicotine and tobacco, and limiting alcohol use. What can I expect for my preventive care visit? Physical exam Your health care provider will check your:  Height and weight. This may be used to calculate body mass index (BMI), which tells if you are at a healthy weight.  Heart rate and blood pressure.  Skin for abnormal spots. Counseling Your health care provider may ask you questions about your:  Alcohol, tobacco, and drug use.  Emotional well-being.  Home and relationship well-being.  Sexual activity.  Eating habits.  Work and work Statistician.  Method of birth control.  Menstrual cycle.  Pregnancy history. What immunizations do I need?  Influenza (flu) vaccine  This is recommended every year. Tetanus, diphtheria, and pertussis (Tdap) vaccine  You may need a Td booster every 10 years. Varicella (chickenpox) vaccine  You may need this if you have not been vaccinated. Human papillomavirus (HPV) vaccine  If recommended by your health care provider, you may need three doses over 6 months. Measles, mumps, and rubella (MMR) vaccine  You may need at least one dose of MMR. You may also need a second dose. Meningococcal conjugate (MenACWY) vaccine  One dose is recommended if you are age 74-21 years and a first-year college student living in a residence hall, or if you have one of several medical conditions. You may also need additional booster doses. Pneumococcal  conjugate (PCV13) vaccine  You may need this if you have certain conditions and were not previously vaccinated. Pneumococcal polysaccharide (PPSV23) vaccine  You may need one or two doses if you smoke cigarettes or if you have certain conditions. Hepatitis A vaccine  You may need this if you have certain conditions or if you travel or work in places where you may be exposed to hepatitis A. Hepatitis B vaccine  You may need this if you have certain conditions or if you travel or work in places where you may be exposed to hepatitis B. Haemophilus influenzae type b (Hib) vaccine  You may need this if you have certain conditions. You may receive vaccines as individual doses or as more than one vaccine together in one shot (combination vaccines). Talk with your health care provider about the risks and benefits of combination vaccines. What tests do I need?  Blood tests  Lipid and cholesterol levels. These may be checked every 5 years starting at age 54.  Hepatitis C test.  Hepatitis B test. Screening  Diabetes screening. This is done by checking your blood sugar (glucose) after you have not eaten for a while (fasting).  Sexually transmitted disease (STD) testing.  BRCA-related cancer screening. This may be done if you have a family history of breast, ovarian, tubal, or peritoneal cancers.  Pelvic exam and Pap test. This may be done every 3 years starting at age 38. Starting at age 74, this may be done every 5 years if you have a Pap test in combination with an HPV test. Talk with your health care provider about your test  results, treatment options, and if necessary, the need for more tests. Follow these instructions at home: Eating and drinking   Eat a diet that includes fresh fruits and vegetables, whole grains, lean protein, and low-fat dairy.  Take vitamin and mineral supplements as recommended by your health care provider.  Do not drink alcohol if: ? Your health care  provider tells you not to drink. ? You are pregnant, may be pregnant, or are planning to become pregnant.  If you drink alcohol: ? Limit how much you have to 0-1 drink a day. ? Be aware of how much alcohol is in your drink. In the [place] one drink equals one 12 oz bottle of beer (355 mL), one 5 oz glass of wine (148 mL), or one 1 oz glass of hard liquor (44 mL). Lifestyle  Take daily care of your teeth and gums.  Stay active. Exercise for at least 30 minutes on 5 or more days each week.  Do not use any products that contain nicotine or tobacco, such as cigarettes, e-cigarettes, and chewing tobacco. If you need help quitting, ask your health care provider.  If you are sexually active, practice safe sex. Use a condom or other form of birth control (contraception) in order to prevent pregnancy and STIs (sexually transmitted infections). If you plan to become pregnant, see your health care provider for a preconception visit. What's next?  Visit your health care provider once a year for a well check visit.  Ask your health care provider how often you should have your eyes and teeth checked.  Stay up to date on all vaccines. This information is not intended to replace advice given to you by your health care provider. Make sure you discuss any questions you have with your health care provider. Document Released: 07/15/2001 Document Revised: 01/28/2018 Document Reviewed: 01/28/2018 Elsevier Patient Education  [employer].   If you have lab work done today you will be contacted with your lab results within the next 2 weeks.  If you have not heard from [place] then please contact us. The fastest way to get your results is to register for My Chart.   IF you received an x-ray today, you will receive an invoice from [facility]. Please contact [facility] at [phone] with questions or concerns regarding your invoice.   IF you received labwork today, you will receive an  invoice from [facility]. Please contact [facility] at [phone] with questions or concerns regarding your invoice.   Our billing staff will not be able to assist you with questions regarding bills from these companies.  You will be contacted with the lab results as soon as they are available. The fastest way to get your results is to activate your My Chart account. Instructions are located on the last page of this paperwork. If you have not heard from [place] regarding the results in 2 weeks, please contact this office.

## 2019-01-25 LAB — LIPID PANEL
Chol/HDL Ratio: 3.8 ratio (ref 0.0–4.4)
Cholesterol, Total: 190 mg/dL (ref 100–199)
HDL: 50 mg/dL (ref >39)
LDL Calculated: 126 mg/dL — ABNORMAL HIGH (ref 0–99)
Triglycerides: 69 mg/dL (ref 0–149)
VLDL Cholesterol Cal: 14 mg/dL (ref 5–40)

## 2019-01-25 LAB — HEMOGLOBIN A1C
Est. average glucose Bld gHb Est-mCnc: 117 mg/dL
Hgb A1c MFr Bld: 5.7 % — ABNORMAL HIGH (ref 4.8–5.6)

## 2019-02-10 ENCOUNTER — Encounter: Payer: Self-pay | Admitting: Family Medicine

## 2019-02-14 DIAGNOSIS — Z01419 Encounter for gynecological examination (general) (routine) without abnormal findings: Secondary | ICD-10-CM | POA: Diagnosis not present

## 2019-02-14 DIAGNOSIS — N760 Acute vaginitis: Secondary | ICD-10-CM | POA: Diagnosis not present

## 2019-02-15 IMAGING — CR DG ANKLE COMPLETE 3+V*L*
3 series · 3 of 3 positions shown · non-contrast
Comparison: None.

CLINICAL DATA: Medial left ankle pain for 2 days.

EXAM:
LEFT ANKLE COMPLETE - 3+ VIEW

[ankle ap]
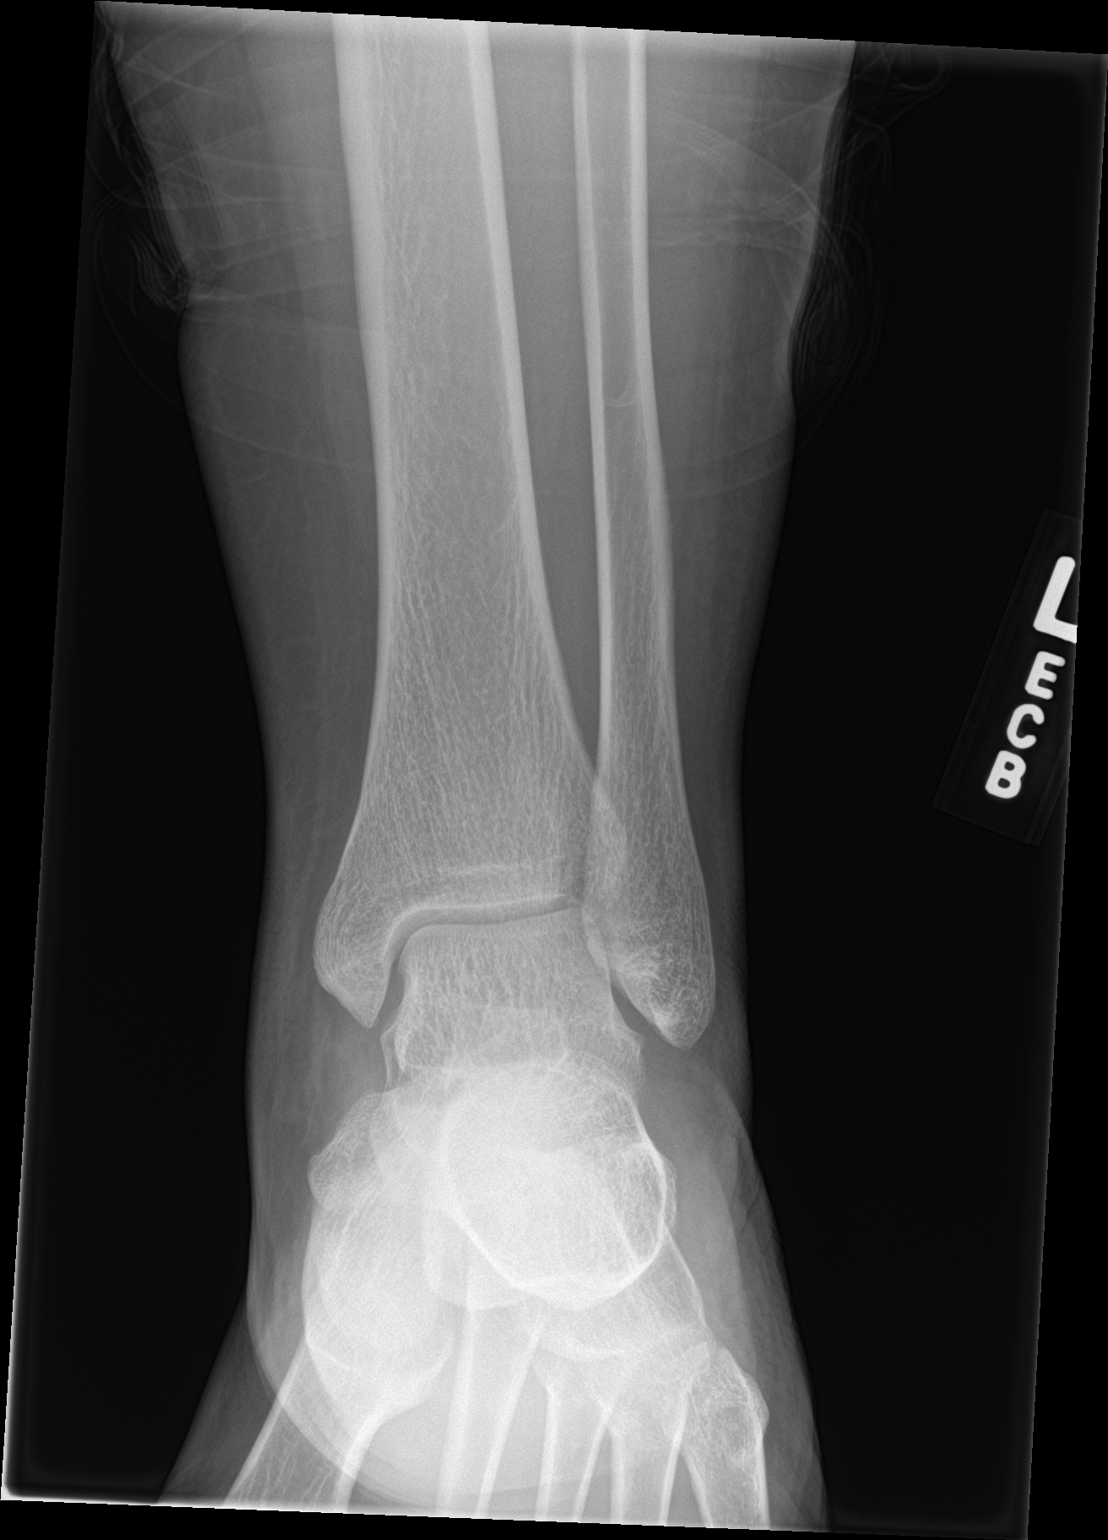

[ankle obl]
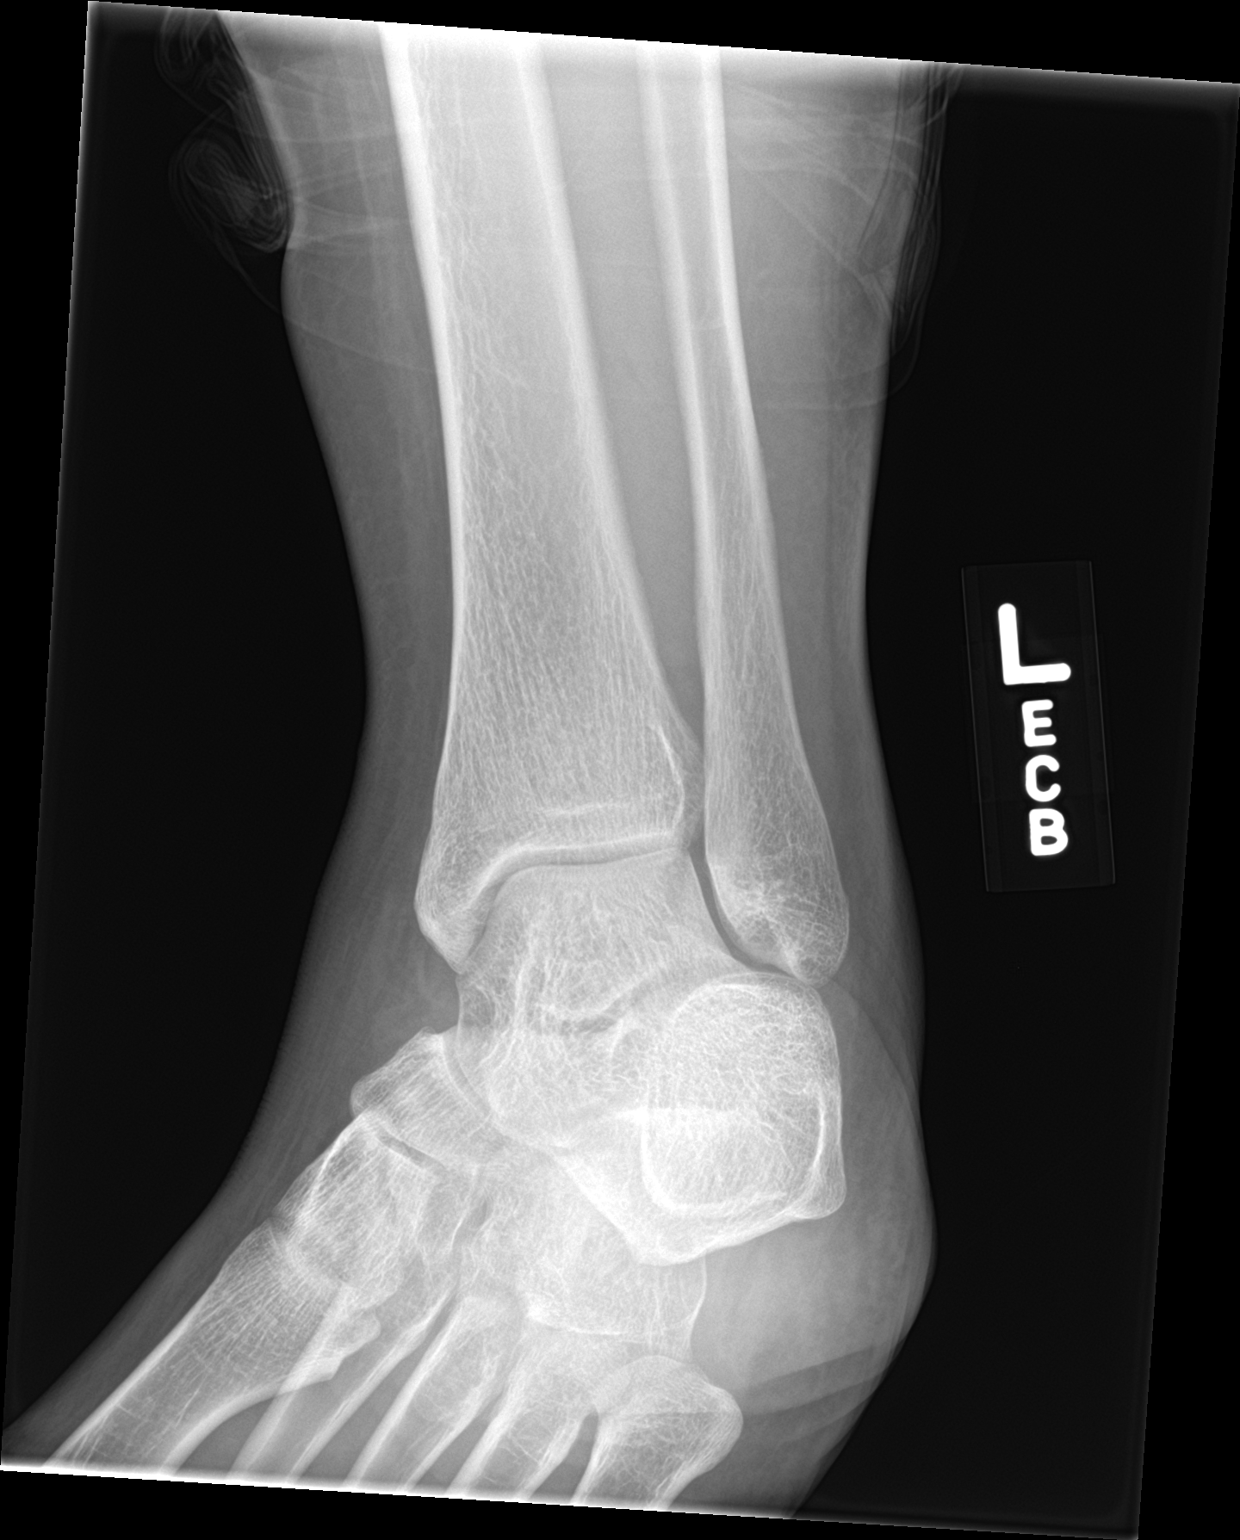

[ankle lat]
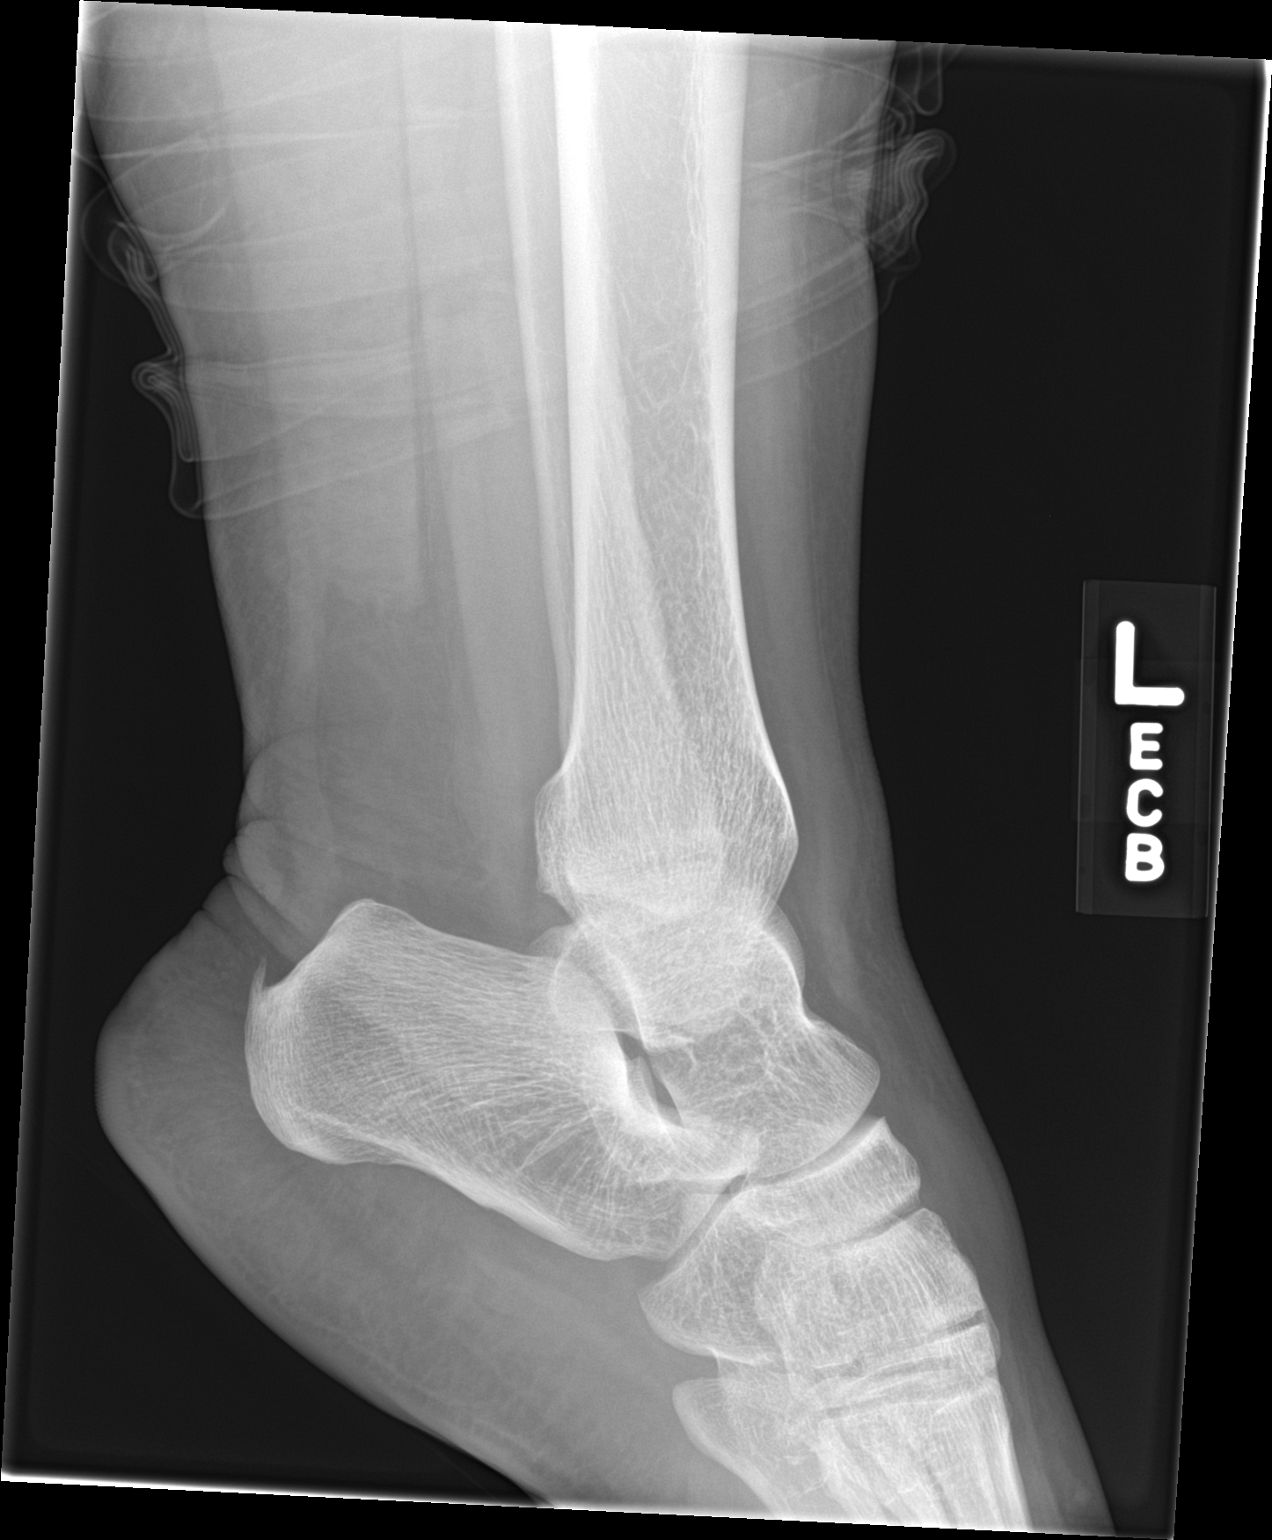

[3 of 3 positions shown; findings below may reference images not displayed]

FINDINGS: There is no evidence of fracture, dislocation, or joint effusion.
There is no evidence of arthropathy or other focal bone abnormality.
Soft tissues are unremarkable.
IMPRESSION: Negative.

## 2019-03-18 ENCOUNTER — Ambulatory Visit: Payer: BLUE CROSS/BLUE SHIELD | Admitting: Family Medicine

## 2019-03-21 ENCOUNTER — Encounter: Payer: Self-pay | Admitting: Family Medicine

## 2019-04-29 ENCOUNTER — Telehealth: Payer: BLUE CROSS/BLUE SHIELD | Admitting: Family

## 2019-04-29 DIAGNOSIS — R300 Dysuria: Secondary | ICD-10-CM

## 2019-04-29 NOTE — Progress Notes (Signed)
Based on what you shared with me, I feel your condition warrants further evaluation and I recommend that you be seen for a face to face office visit.   NOTE: If you entered your credit card information for this eVisit, you will not be charged. You may see a "hold" on your card for the $35 but that hold will drop off and you will not have a charge processed.   If you are having a true medical emergency please call 911.      For an urgent face to face visit, [facility] has five urgent care centers for your convenience:      NEW:  [facility] Get Driving Directions [phone] [address] [place], [place] [address] . 10 am - 6pm Monday - Friday    [facility] ([place]) Get Driving Directions [phone] [address] , [place] [address] . 10 am to 8 pm Monday-Friday . 12 pm to 8 pm Saturday-Sunday     [facility] Get Driving Directions [phone] [address], [address] [place], [place] [address] . 8 am to 8 pm Monday-Friday . 9 am to 6 pm Saturday . 11 am to 6 pm Sunday     [facility] [place] Get Driving Directions  [phone] [address] [place], [place] [address] . 8 am to 8 pm Monday-Friday . 8 am to 4 pm Saturday-Sunday    [facility] Get Driving Directions [phone] [address] [place], [place] [address] . 12 pm to 6 pm Monday-Friday      Your e-visit answers were reviewed by a board certified advanced clinical practitioner to complete your personal care plan.  Thank you for using e-Visits.     

## 2019-06-11 DIAGNOSIS — Z20828 Contact with and (suspected) exposure to other viral communicable diseases: Secondary | ICD-10-CM | POA: Diagnosis not present

## 2019-06-13 ENCOUNTER — Encounter: Payer: Self-pay | Admitting: Family Medicine

## 2019-06-23 DIAGNOSIS — Z20828 Contact with and (suspected) exposure to other viral communicable diseases: Secondary | ICD-10-CM | POA: Diagnosis not present

## 2019-06-23 DIAGNOSIS — Z03818 Encounter for observation for suspected exposure to other biological agents ruled out: Secondary | ICD-10-CM | POA: Diagnosis not present

## 2019-06-29 DIAGNOSIS — R109 Unspecified abdominal pain: Secondary | ICD-10-CM | POA: Diagnosis not present

## 2019-06-29 DIAGNOSIS — Z3201 Encounter for pregnancy test, result positive: Secondary | ICD-10-CM | POA: Diagnosis not present

## 2019-06-29 DIAGNOSIS — Z3689 Encounter for other specified antenatal screening: Secondary | ICD-10-CM | POA: Diagnosis not present

## 2019-07-11 ENCOUNTER — Inpatient Hospital Stay (HOSPITAL_COMMUNITY)
Admission: AD | Admit: 2019-07-11 | Discharge: 2019-07-11 | Disposition: A | Discharge status: Home / Self Care | Payer: BC Managed Care – PPO | Attending: Obstetrics and Gynecology | Admitting: Obstetrics and Gynecology

## 2019-07-11 ENCOUNTER — Encounter (HOSPITAL_COMMUNITY): Payer: Self-pay | Admitting: Obstetrics and Gynecology

## 2019-07-11 ENCOUNTER — Inpatient Hospital Stay (HOSPITAL_COMMUNITY): Payer: BC Managed Care – PPO

## 2019-07-11 ENCOUNTER — Other Ambulatory Visit: Payer: Self-pay

## 2019-07-11 DIAGNOSIS — Z3A08 8 weeks gestation of pregnancy: Secondary | ICD-10-CM | POA: Insufficient documentation

## 2019-07-11 DIAGNOSIS — Z3A01 Less than 8 weeks gestation of pregnancy: Secondary | ICD-10-CM | POA: Diagnosis not present

## 2019-07-11 DIAGNOSIS — O209 Hemorrhage in early pregnancy, unspecified: Secondary | ICD-10-CM | POA: Diagnosis not present

## 2019-07-11 DIAGNOSIS — O4691 Antepartum hemorrhage, unspecified, first trimester: Secondary | ICD-10-CM

## 2019-07-11 DIAGNOSIS — O99891 Other specified diseases and conditions complicating pregnancy: Secondary | ICD-10-CM | POA: Insufficient documentation

## 2019-07-11 DIAGNOSIS — M545 Low back pain: Secondary | ICD-10-CM | POA: Diagnosis not present

## 2019-07-11 DIAGNOSIS — M549 Dorsalgia, unspecified: Secondary | ICD-10-CM | POA: Diagnosis not present

## 2019-07-11 DIAGNOSIS — O26891 Other specified pregnancy related conditions, first trimester: Secondary | ICD-10-CM

## 2019-07-11 DIAGNOSIS — Z3491 Encounter for supervision of normal pregnancy, unspecified, first trimester: Secondary | ICD-10-CM

## 2019-07-11 DIAGNOSIS — O4690 Antepartum hemorrhage, unspecified, unspecified trimester: Secondary | ICD-10-CM

## 2019-07-11 LAB — URINALYSIS, ROUTINE W REFLEX MICROSCOPIC
Bilirubin Urine: NEGATIVE
Glucose, UA: NEGATIVE mg/dL
Hgb urine dipstick: NEGATIVE
Ketones, ur: 5 mg/dL — AB
Leukocytes,Ua: NEGATIVE
Nitrite: NEGATIVE
Protein, ur: NEGATIVE mg/dL
Specific Gravity, Urine: 1.028 (ref 1.005–1.030)
pH: 6 (ref 5.0–8.0)

## 2019-07-11 LAB — HCG, QUANTITATIVE, PREGNANCY: hCG, Beta Chain, Quant, S: 76757 m[IU]/mL — ABNORMAL HIGH (ref <5)

## 2019-07-11 LAB — WET PREP, GENITAL
Clue Cells Wet Prep HPF POC: NONE SEEN
Sperm: NONE SEEN
Trich, Wet Prep: NONE SEEN
Yeast Wet Prep HPF POC: NONE SEEN

## 2019-07-11 LAB — CBC
HCT: 33.7 % — ABNORMAL LOW (ref 36.0–46.0)
Hemoglobin: 10.5 g/dL — ABNORMAL LOW (ref 12.0–15.0)
MCH: 25.7 pg — ABNORMAL LOW (ref 26.0–34.0)
MCHC: 31.2 g/dL (ref 30.0–36.0)
MCV: 82.6 fL (ref 80.0–100.0)
Platelets: 337 10*3/uL (ref 150–400)
RBC: 4.08 MIL/uL (ref 3.87–5.11)
RDW: 15 % (ref 11.5–15.5)
WBC: 7.9 10*3/uL (ref 4.0–10.5)
nRBC: 0 % (ref 0.0–0.2)

## 2019-07-11 LAB — ABO/RH: ABO/RH(D): O POS

## 2019-07-11 LAB — POCT PREGNANCY, URINE: Preg Test, Ur: POSITIVE — AB

## 2019-07-11 NOTE — MAU Provider Note (Signed)
History     CSN: [id number]  Arrival date and time: 07/11/19 1705   First Provider Initiated Contact with Patient 07/11/19 2013      Chief Complaint  Patient presents with  . Vaginal Bleeding  . Back Pain  . Abdominal Pain   Catherine Mccoy is a 29 y.o. G1P0 at [redacted]w[redacted]d by LMP who presents to MAU with complaints of vaginal bleeding, abdominal pain and back pain. She reports vaginal bleeding started occurring yesterday. Describes the vaginal bleeding as reddish brown spotting - denies it being enough to have to wear a pad or panty liner. She reports vaginal spotting continued today. Reports lower back pain and lower abdominal cramping that started occurring today - rates pain 3/10 - has not taken any medication for back/abdominal pain. She denies vaginal discharge, urinary symptoms, nausea or vomiting.   Appointment on Thursday at [facility]  OB History    Gravida  1   Para      Term      Preterm      AB      Living        SAB      TAB      Ectopic      Multiple      Live Births              History reviewed. No pertinent past medical history.  Past Surgical History:  Procedure Laterality Date  . WISDOM TOOTH EXTRACTION      Family History  Problem Relation Age of Onset  . Hyperlipidemia Mother   . Healthy Mother   . Hyperlipidemia Father   . Healthy Father   . Healthy Sister   . Healthy Brother     Social History   Tobacco Use  . Smoking status: Never Smoker  . Smokeless tobacco: Never Used  Substance Use Topics  . Alcohol use: No  . Drug use: Never    Allergies: No Known Allergies  Medications Prior to Admission  Medication Sig Dispense Refill Last Dose  . acetaminophen (TYLENOL) 500 MG tablet Take 500 mg by mouth every 6 (six) hours as needed.   07/10/2019 at Unknown time    Review of Systems  Constitutional: Negative.   Respiratory: Negative.   Cardiovascular: Negative.   Gastrointestinal: Positive for abdominal pain. Negative for  constipation, diarrhea, nausea and vomiting.  Genitourinary: Positive for vaginal bleeding. Negative for difficulty urinating, dysuria, frequency, pelvic pain, urgency and vaginal discharge.  Musculoskeletal: Positive for back pain. Negative for neck pain.  Neurological: Negative.   Psychiatric/Behavioral: Negative.    Physical Exam   Blood pressure 139/86, pulse 86, temperature (!) 97.5 F (36.4 C), temperature source Oral, resp. rate 18, weight 118.7 kg, last menstrual period 05/16/2019, SpO2 100 %.  Physical Exam  Vitals reviewed. Constitutional: She is oriented to person, place, and time. She appears well-developed and well-nourished. No distress.  Cardiovascular: Normal rate and regular rhythm.  Respiratory: Effort normal and breath sounds normal. No respiratory distress. She has no wheezes.  GI: Soft. She exhibits no distension. There is no abdominal tenderness. There is no rebound and no guarding.  Genitourinary:    Vaginal discharge present.     No vaginal bleeding.  No bleeding in the vagina.    Genitourinary Comments: Pelvic exam: Cervix pink, visually closed, without lesion, scant white creamy discharge, no vaginal bleeding, vaginal walls and external genitalia normal Bimanual exam: Cervix 0/long/high, firm, anterior, neg CMT, uterus nontender, nonenlarged, adnexa without tenderness, enlargement,  or mass   Musculoskeletal:        General: No edema. Normal range of motion.  Neurological: She is alert and oriented to person, place, and time.  Psychiatric: She has a normal mood and affect. Her behavior is normal. Thought content normal.    MAU Course  Procedures  MDM Orders Placed This Encounter  Procedures  . Wet prep, genital  . US OB Comp Less 14 Wks  . Urinalysis, Routine w reflex microscopic  . CBC  . hCG, quantitative, pregnancy  . Pregnancy, urine POC  . ABO/Rh   Labs and [place] report reviewed:  Results for orders placed or performed during the hospital  encounter of 07/11/19 (from the past 24 hour(s))  Pregnancy, urine POC     Status: Abnormal   Collection Time: 07/11/19  6:05 PM  Result Value Ref Range   Preg Test, Ur POSITIVE (A) NEGATIVE  Urinalysis, Routine w reflex microscopic     Status: Abnormal   Collection Time: 07/11/19  6:07 PM  Result Value Ref Range   Color, Urine YELLOW YELLOW   APPearance CLEAR CLEAR   Specific Gravity, Urine 1.028 1.005 - 1.030   pH 6.0 5.0 - 8.0   Glucose, UA NEGATIVE NEGATIVE mg/dL   Hgb urine dipstick NEGATIVE NEGATIVE   Bilirubin Urine NEGATIVE NEGATIVE   Ketones, ur 5 (A) NEGATIVE mg/dL   Protein, ur NEGATIVE NEGATIVE mg/dL   Nitrite NEGATIVE NEGATIVE   Leukocytes,Ua NEGATIVE NEGATIVE  CBC     Status: Abnormal   Collection Time: 07/11/19  8:20 PM  Result Value Ref Range   WBC 7.9 4.0 - 10.5 K/uL   RBC 4.08 3.87 - 5.11 MIL/uL   Hemoglobin 10.5 (L) 12.0 - 15.0 g/dL   HCT 10.9 (L) 32.3 - 55.7 %   MCV 82.6 80.0 - 100.0 fL   MCH 25.7 (L) 26.0 - 34.0 pg   MCHC 31.2 30.0 - 36.0 g/dL   RDW 32.2 02.5 - 42.7 %   Platelets 337 150 - 400 K/uL   nRBC 0.0 0.0 - 0.2 %  ABO/Rh     Status: None   Collection Time: 07/11/19  8:20 PM  Result Value Ref Range   ABO/RH(D) O POS    No rh immune globuloin      NOT A RH IMMUNE GLOBULIN CANDIDATE, PT RH POSITIVE Performed at [facility], [address] [place], [place] [address]   hCG, quantitative, pregnancy     Status: Abnormal   Collection Time: 07/11/19  8:20 PM  Result Value Ref Range   hCG, Beta Chain, Quant, S 76,757 (H) <5 mIU/mL  Wet prep, genital     Status: Abnormal   Collection Time: 07/11/19  8:26 PM  Result Value Ref Range   Yeast Wet Prep HPF POC NONE SEEN NONE SEEN   Trich, Wet Prep NONE SEEN NONE SEEN   Clue Cells Wet Prep HPF POC NONE SEEN NONE SEEN   WBC, Wet Prep HPF POC MANY (A) NONE SEEN   Sperm NONE SEEN    US OB Comp Less 14 Wks  Result Date: 07/11/2019 CLINICAL DATA:  Bleeding, cramping EXAM: OBSTETRIC <14 WK  ULTRASOUND TECHNIQUE: Transabdominal ultrasound was performed for evaluation of the gestation as well as the maternal uterus and adnexal regions. COMPARISON:  None. FINDINGS: Intrauterine gestational sac: Single Yolk sac:  Visualized Embryo:  Visualized Cardiac Activity: Visualized Heart Rate: 150 bpm MSD:    mm    w  d CRL:   14 mm   7 w 5 d                  [place] EDC: 02/22/2020 Subchorionic hemorrhage:  None visualized. Maternal uterus/adnexae: No adnexal mass or free fluid. IMPRESSION: Seven week 5 day intrauterine pregnancy. Fetal heart rate 150 beats per minute. No acute maternal findings. Electronically Signed   By: Rolm Baptise M.D.   On: 07/11/2019 21:03   Discussed results of [place] and labs with patient. [place] dating consistent with LMP. No active vaginal bleeding on pelvic examination. ABO/Rh + Rhogam not indicated.   Bleeding precautions and pelvic rest for 7 days after bleeding discussed. Safe medications during pregnancy reviewed - encouraged to stay away from ibuprofen at this stage of pregnancy. Follow up as scheduled for prenatal care on Thursday. Discussed reasons to return to MAU. Discussed reasons to return to MAU. Return to MAU as needed. Pt stable at time of discharge. Patient discharged at 2200.   Assessment and Plan   1. Normal IUP (intrauterine pregnancy) on prenatal ultrasound, first trimester   2. Vaginal bleeding during pregnancy   3. [redacted] weeks gestation of pregnancy   4. Back pain during pregnancy in first trimester    Discharge home Follow up as scheduled in the office for prenatal care Return to MAU as needed for reasons discussed and/or emergencies  Bleeding precautions and pelvic rest   Follow-up Information    [facility], [facility] Follow up.   Why: Follow up as scheduled for prenatal appointment  Contact information: [facility] [phone]          Allergies as of 07/11/2019   No Known Allergies     Medication List    TAKE these  medications   acetaminophen 500 MG tablet Commonly known as: TYLENOL Take 500 mg by mouth every 6 (six) hours as needed.       Lajean Manes CNM 07/12/2019, 12:07 AM

## 2019-07-11 NOTE — MAU Note (Signed)
Was bleeding yesterday, not that heavy, had some spotting this morning.  Some pressure in her back, cramping in lower abd.  preg confirmed a few wks ago at OB/GYN office.

## 2019-07-12 LAB — GC/CHLAMYDIA PROBE AMP (~~LOC~~) NOT AT ARMC
Chlamydia: NEGATIVE
Comment: NEGATIVE
Comment: NORMAL
Neisseria Gonorrhea: NEGATIVE

## 2019-07-14 DIAGNOSIS — Z3201 Encounter for pregnancy test, result positive: Secondary | ICD-10-CM | POA: Diagnosis not present

## 2019-07-18 DIAGNOSIS — Z640 Problems related to unwanted pregnancy: Secondary | ICD-10-CM | POA: Diagnosis not present

## 2019-10-22 DIAGNOSIS — R8281 Pyuria: Secondary | ICD-10-CM | POA: Diagnosis not present

## 2019-10-22 DIAGNOSIS — Z113 Encounter for screening for infections with a predominantly sexual mode of transmission: Secondary | ICD-10-CM | POA: Diagnosis not present

## 2019-12-02 DIAGNOSIS — R519 Headache, unspecified: Secondary | ICD-10-CM | POA: Diagnosis not present

## 2020-01-27 ENCOUNTER — Encounter: Payer: BLUE CROSS/BLUE SHIELD | Admitting: Family Medicine

## 2020-01-30 ENCOUNTER — Encounter: Payer: Self-pay | Admitting: Family Medicine

## 2020-06-29 ENCOUNTER — Telehealth: Payer: Self-pay | Admitting: Family Medicine

## 2020-06-29 ENCOUNTER — Encounter: Payer: BC Managed Care – PPO | Admitting: Registered Nurse

## 2020-08-27 ENCOUNTER — Ambulatory Visit: Payer: BC Managed Care – PPO | Admitting: Emergency Medicine

## 2021-01-21 ENCOUNTER — Ambulatory Visit: Payer: BC Managed Care – PPO | Admitting: Emergency Medicine

## 2021-03-11 ENCOUNTER — Ambulatory Visit (INDEPENDENT_AMBULATORY_CARE_PROVIDER_SITE_OTHER): Payer: Self-pay | Admitting: Emergency Medicine

## 2021-03-11 ENCOUNTER — Encounter: Payer: Self-pay | Admitting: Emergency Medicine

## 2021-03-11 ENCOUNTER — Other Ambulatory Visit: Payer: Self-pay

## 2021-03-11 VITALS — BP 118/70 | HR 86 | Temp 98.6°F | Ht 64.0 in | Wt 250.0 lb

## 2021-03-11 DIAGNOSIS — Z130 Encounter for screening for diseases of the blood and blood-forming organs and certain disorders involving the immune mechanism: Secondary | ICD-10-CM | POA: Diagnosis not present

## 2021-03-11 DIAGNOSIS — Z1329 Encounter for screening for other suspected endocrine disorder: Secondary | ICD-10-CM | POA: Diagnosis not present

## 2021-03-11 DIAGNOSIS — Z13228 Encounter for screening for other metabolic disorders: Secondary | ICD-10-CM | POA: Diagnosis not present

## 2021-03-11 DIAGNOSIS — Z13220 Encounter for screening for lipoid disorders: Secondary | ICD-10-CM

## 2021-03-11 DIAGNOSIS — Z0000 Encounter for general adult medical examination without abnormal findings: Secondary | ICD-10-CM

## 2021-03-11 DIAGNOSIS — Z1159 Encounter for screening for other viral diseases: Secondary | ICD-10-CM

## 2021-03-11 LAB — CBC WITH DIFFERENTIAL/PLATELET
Basophils Absolute: 0 10*3/uL (ref 0.0–0.1)
Basophils Relative: 0.3 % (ref 0.0–3.0)
Eosinophils Absolute: 0.1 10*3/uL (ref 0.0–0.7)
Eosinophils Relative: 0.8 % (ref 0.0–5.0)
HCT: 33.8 % — ABNORMAL LOW (ref 36.0–46.0)
Hemoglobin: 11 g/dL — ABNORMAL LOW (ref 12.0–15.0)
Lymphocytes Relative: 47.8 % — ABNORMAL HIGH (ref 12.0–46.0)
Lymphs Abs: 2.9 10*3/uL (ref 0.7–4.0)
MCHC: 32.5 g/dL (ref 30.0–36.0)
MCV: 80.4 fl (ref 78.0–100.0)
Monocytes Absolute: 0.4 10*3/uL (ref 0.1–1.0)
Monocytes Relative: 6 % (ref 3.0–12.0)
Neutro Abs: 2.7 10*3/uL (ref 1.4–7.7)
Neutrophils Relative %: 45.1 % (ref 43.0–77.0)
Platelets: 364 10*3/uL (ref 150.0–400.0)
RBC: 4.2 Mil/uL (ref 3.87–5.11)
RDW: 16 % — ABNORMAL HIGH (ref 11.5–15.5)
WBC: 6.1 10*3/uL (ref 4.0–10.5)

## 2021-03-11 LAB — LIPID PANEL
Cholesterol: 174 mg/dL (ref 0–200)
HDL: 48.8 mg/dL (ref >39.00)
LDL Cholesterol: 111 mg/dL — ABNORMAL HIGH (ref 0–99)
NonHDL: 124.71
Total CHOL/HDL Ratio: 4
Triglycerides: 67 mg/dL (ref 0.0–149.0)
VLDL: 13.4 mg/dL (ref 0.0–40.0)

## 2021-03-11 LAB — COMPREHENSIVE METABOLIC PANEL
ALT: 18 U/L (ref 0–35)
AST: 22 U/L (ref 0–37)
Albumin: 3.9 g/dL (ref 3.5–5.2)
Alkaline Phosphatase: 86 U/L (ref 39–117)
BUN: 11 mg/dL (ref 6–23)
CO2: 30 mEq/L (ref 19–32)
Calcium: 9.2 mg/dL (ref 8.4–10.5)
Chloride: 101 mEq/L (ref 96–112)
Creatinine, Ser: 0.79 mg/dL (ref 0.40–1.20)
GFR: 99.47 mL/min (ref >60.00)
Glucose, Bld: 82 mg/dL (ref 70–99)
Potassium: 4 mEq/L (ref 3.5–5.1)
Sodium: 137 mEq/L (ref 135–145)
Total Bilirubin: 0.3 mg/dL (ref 0.2–1.2)
Total Protein: 7.6 g/dL (ref 6.0–8.3)

## 2021-03-11 LAB — HEMOGLOBIN A1C: Hgb A1c MFr Bld: 5.7 % (ref 4.6–6.5)

## 2021-03-11 LAB — TSH: TSH: 2.3 u[IU]/mL (ref 0.35–5.50)

## 2021-03-11 NOTE — Progress Notes (Signed)
Gardner Candle 30 y.o.   Chief Complaint  Patient presents with   New Patient (Initial Visit)    Would like a physical    HISTORY OF PRESENT ILLNESS: This is a 30 y.o. female first visit to this office, here to establish care with me. Wants a physical, annual exam. General health self grade: B- due to nutrition concerns of being overweight. No chronic medical problems. On no chronic medications. No other complaints or medical concerns today.  HPI   Prior to Admission medications   Medication Sig Start Date End Date Taking? Authorizing Provider  acetaminophen (TYLENOL) 500 MG tablet Take 500 mg by mouth every 6 (six) hours as needed. Patient not taking: Reported on 03/11/2021    [provider]    No Known Allergies  There are no problems to display for this patient.   History reviewed. No pertinent past medical history.  Past Surgical History:  Procedure Laterality Date   WISDOM TOOTH EXTRACTION      Social History   Socioeconomic History   Marital status: Single    Spouse name: Not on file   Number of children: Not on file   Years of education: Not on file   Highest education level: Not on file  Occupational History   Not on file  Tobacco Use   Smoking status: Never   Smokeless tobacco: Never  Vaping Use   Vaping Use: Never used  Substance and Sexual Activity   Alcohol use: No   Drug use: Never   Sexual activity: Never    Birth control/protection: None  Other Topics Concern   Not on file  Social History Narrative   Not on file   Social Determinants of Health   Financial Resource Strain: Not on file  Food Insecurity: Not on file  Transportation Needs: Not on file  Physical Activity: Not on file  Stress: Not on file  Social Connections: Not on file  Intimate Partner Violence: Not on file    Family History  Problem Relation Age of Onset   Hyperlipidemia Mother    Healthy Mother    Hyperlipidemia Father    Healthy Father    Healthy  Sister    Healthy Brother      Review of Systems  Constitutional: Negative.  Negative for chills and fever.  HENT: Negative.  Negative for congestion and sore throat.   Eyes: Negative.   Respiratory: Negative.  Negative for cough and shortness of breath.   Cardiovascular: Negative.  Negative for chest pain and palpitations.  Gastrointestinal: Negative.  Negative for abdominal pain, diarrhea, nausea and vomiting.  Genitourinary: Negative.  Negative for dysuria.  Musculoskeletal: Negative.   Skin: Negative.  Negative for rash.  Neurological: Negative.  Negative for dizziness and headaches.  All other systems reviewed and are negative.  Vitals:   03/11/21 1308  BP: 118/70  Pulse: 86  Temp: 98.6 F (37 C)  SpO2: 99%    Physical Exam Vitals reviewed.  Constitutional:      Appearance: She is obese.  HENT:     Head: Normocephalic.     Right Ear: Tympanic membrane, ear canal and external ear normal.     Left Ear: Tympanic membrane, ear canal and external ear normal.     Mouth/Throat:     Mouth: Mucous membranes are moist.     Pharynx: Oropharynx is clear.  Eyes:     Extraocular Movements: Extraocular movements intact.     Conjunctiva/sclera: Conjunctivae normal.  Pupils: Pupils are equal, round, and reactive to light.  Cardiovascular:     Rate and Rhythm: Normal rate and regular rhythm.     Pulses: Normal pulses.     Heart sounds: Normal heart sounds.  Pulmonary:     Effort: Pulmonary effort is normal.     Breath sounds: Normal breath sounds.  Abdominal:     General: Bowel sounds are normal. There is no distension.     Palpations: Abdomen is soft. There is no mass.     Tenderness: There is no abdominal tenderness.  Musculoskeletal:        General: Normal range of motion.     Cervical back: Normal range of motion and neck supple. No tenderness.     Right lower leg: No edema.     Left lower leg: No edema.  Lymphadenopathy:     Cervical: No cervical adenopathy.   Skin:    General: Skin is warm and dry.     Capillary Refill: Capillary refill takes less than 2 seconds.  Neurological:     General: No focal deficit present.     Mental Status: She is alert and oriented to person, place, and time.  Psychiatric:        Mood and Affect: Mood normal.        Behavior: Behavior normal.     ASSESSMENT & PLAN: Guy was seen today for new patient (initial visit).  Diagnoses and all orders for this visit:  Routine general medical examination at a health care facility -     Ambulatory referral to Gynecology  Need for hepatitis C screening test -     Hepatitis C antibody screen  Screening for deficiency anemia -     CBC with Differential  Screening for lipoid disorders -     Lipid panel  Screening for endocrine, metabolic and immunity disorder -     Comprehensive metabolic panel -     Hemoglobin A1c -     TSH  Modifiable risk factors discussed with patient. Anticipatory guidance according to age provided. The following topics were also discussed: Social Determinants of Health Smoking.  Non-smoker Diet and nutrition and need to decrease amount of daily carbohydrate intake Benefits of exercise Cancer family history review Vaccinations recommendations Cardiovascular risk assessment Need to follow-up with gynecologist.  Need for cervical cancer screening Pap smear. Mental health including depression and anxiety Fall and accident prevention  Patient Instructions  Health Maintenance, Female Adopting a healthy lifestyle and getting preventive care are important in promoting health and wellness. Ask your health care provider about: The right schedule for you to have regular tests and exams. Things you can do on your own to prevent diseases and keep yourself healthy. What should I know about diet, weight, and exercise? Eat a healthy diet  Eat a diet that includes plenty of vegetables, fruits, low-fat dairy products, and lean protein. Do not  eat a lot of foods that are high in solid fats, added sugars, or sodium. Maintain a healthy weight Body mass index (BMI) is used to identify weight problems. It estimates body fat based on height and weight. Your health care provider can help determine your BMI and help you achieve or maintain a healthy weight. Get regular exercise Get regular exercise. This is one of the most important things you can do for your health. Most adults should: Exercise for at least 150 minutes each week. The exercise should increase your heart rate and make you sweat (moderate-intensity exercise).  Do strengthening exercises at least twice a week. This is in addition to the moderate-intensity exercise. Spend less time sitting. Even light physical activity can be beneficial. Watch cholesterol and blood lipids Have your blood tested for lipids and cholesterol at 30 years of age, then have this test every 5 years. Have your cholesterol levels checked more often if: Your lipid or cholesterol levels are high. You are older than 31 years of age. You are at high risk for heart disease. What should I know about cancer screening? Depending on your health history and family history, you may need to have cancer screening at various ages. This may include screening for: Breast cancer. Cervical cancer. Colorectal cancer. Skin cancer. Lung cancer. What should I know about heart disease, diabetes, and high blood pressure? Blood pressure and heart disease High blood pressure causes heart disease and increases the risk of stroke. This is more likely to develop in people who have high blood pressure readings, are of African descent, or are overweight. Have your blood pressure checked: Every 3-5 years if you are 56-6 years of age. Every year if you are 3 years old or older. Diabetes Have regular diabetes screenings. This checks your fasting blood sugar level. Have the screening done: Once every three years after age 77 if  you are at a normal weight and have a low risk for diabetes. More often and at a younger age if you are overweight or have a high risk for diabetes. What should I know about preventing infection? Hepatitis B If you have a higher risk for hepatitis B, you should be screened for this virus. Talk with your health care provider to find out if you are at risk for hepatitis B infection. Hepatitis C Testing is recommended for: Everyone born from [birth year] through 1965. Anyone with known risk factors for hepatitis C. Sexually transmitted infections (STIs) Get screened for STIs, including gonorrhea and chlamydia, if: You are sexually active and are younger than 31 years of age. You are older than 31 years of age and your health care provider tells you that you are at risk for this type of infection. Your sexual activity has changed since you were last screened, and you are at increased risk for chlamydia or gonorrhea. Ask your health care provider if you are at risk. Ask your health care provider about whether you are at high risk for HIV. Your health care provider may recommend a prescription medicine to help prevent HIV infection. If you choose to take medicine to prevent HIV, you should first get tested for HIV. You should then be tested every 3 months for as long as you are taking the medicine. Pregnancy If you are about to stop having your period (premenopausal) and you may become pregnant, seek counseling before you get pregnant. Take 400 to 800 micrograms (mcg) of folic acid every day if you become pregnant. Ask for birth control (contraception) if you want to prevent pregnancy. Osteoporosis and menopause Osteoporosis is a disease in which the bones lose minerals and strength with aging. This can result in bone fractures. If you are 68 years old or older, or if you are at risk for osteoporosis and fractures, ask your health care provider if you should: Be screened for bone loss. Take a calcium or  vitamin D supplement to lower your risk of fractures. Be given hormone replacement therapy (HRT) to treat symptoms of menopause. Follow these instructions at home: Lifestyle Do not use any products that contain nicotine or  tobacco, such as cigarettes, e-cigarettes, and chewing tobacco. If you need help quitting, ask your health care provider. Do not use street drugs. Do not share needles. Ask your health care provider for help if you need support or information about quitting drugs. Alcohol use Do not drink alcohol if: Your health care provider tells you not to drink. You are pregnant, may be pregnant, or are planning to become pregnant. If you drink alcohol: Limit how much you use to 0-1 drink a day. Limit intake if you are breastfeeding. Be aware of how much alcohol is in your drink. In the [place] one drink equals one 12 oz bottle of beer (355 mL), one 5 oz glass of wine (148 mL), or one 1 oz glass of hard liquor (44 mL). General instructions Schedule regular health, dental, and eye exams. Stay current with your vaccines. Tell your health care provider if: You often feel depressed. You have ever been abused or do not feel safe at home. Summary Adopting a healthy lifestyle and getting preventive care are important in promoting health and wellness. Follow your health care provider's instructions about healthy diet, exercising, and getting tested or screened for diseases. Follow your health care provider's instructions on monitoring your cholesterol and blood pressure. This information is not intended to replace advice given to you by your health care provider. Make sure you discuss any questions you have with your health care provider. Document Revised: 07/27/2020 Document Reviewed: 05/12/2018 Elsevier Patient Education  2022 Elsevier Inc.     Edwina Barth, MD [facility]

## 2021-03-11 NOTE — Patient Instructions (Signed)
Health Maintenance, Female Adopting a healthy lifestyle and getting preventive care are important in promoting health and wellness. Ask your health care provider about: The right schedule for you to have regular tests and exams. Things you can do on your own to prevent diseases and keep yourself healthy. What should I know about diet, weight, and exercise? Eat a healthy diet  Eat a diet that includes plenty of vegetables, fruits, low-fat dairy products, and lean protein. Do not eat a lot of foods that are high in solid fats, added sugars, or sodium. Maintain a healthy weight Body mass index (BMI) is used to identify weight problems. It estimates body fat based on height and weight. Your health care provider can help determine your BMI and help you achieve or maintain a healthy weight. Get regular exercise Get regular exercise. This is one of the most important things you can do for your health. Most adults should: Exercise for at least 150 minutes each week. The exercise should increase your heart rate and make you sweat (moderate-intensity exercise). Do strengthening exercises at least twice a week. This is in addition to the moderate-intensity exercise. Spend less time sitting. Even light physical activity can be beneficial. Watch cholesterol and blood lipids Have your blood tested for lipids and cholesterol at 31 years of age, then have this test every 5 years. Have your cholesterol levels checked more often if: Your lipid or cholesterol levels are high. You are older than 31 years of age. You are at high risk for heart disease. What should I know about cancer screening? Depending on your health history and family history, you may need to have cancer screening at various ages. This may include screening for: Breast cancer. Cervical cancer. Colorectal cancer. Skin cancer. Lung cancer. What should I know about heart disease, diabetes, and high blood pressure? Blood pressure and heart  disease High blood pressure causes heart disease and increases the risk of stroke. This is more likely to develop in people who have high blood pressure readings, are of African descent, or are overweight. Have your blood pressure checked: Every 3-5 years if you are 18-39 years of age. Every year if you are 40 years old or older. Diabetes Have regular diabetes screenings. This checks your fasting blood sugar level. Have the screening done: Once every three years after age 40 if you are at a normal weight and have a low risk for diabetes. More often and at a younger age if you are overweight or have a high risk for diabetes. What should I know about preventing infection? Hepatitis B If you have a higher risk for hepatitis B, you should be screened for this virus. Talk with your health care provider to find out if you are at risk for hepatitis B infection. Hepatitis C Testing is recommended for: Everyone born from 1945 through 1965. Anyone with known risk factors for hepatitis C. Sexually transmitted infections (STIs) Get screened for STIs, including gonorrhea and chlamydia, if: You are sexually active and are younger than 31 years of age. You are older than 31 years of age and your health care provider tells you that you are at risk for this type of infection. Your sexual activity has changed since you were last screened, and you are at increased risk for chlamydia or gonorrhea. Ask your health care provider if you are at risk. Ask your health care provider about whether you are at high risk for HIV. Your health care provider may recommend a prescription medicine   to help prevent HIV infection. If you choose to take medicine to prevent HIV, you should first get tested for HIV. You should then be tested every 3 months for as long as you are taking the medicine. Pregnancy If you are about to stop having your period (premenopausal) and you may become pregnant, seek counseling before you get  pregnant. Take 400 to 800 micrograms (mcg) of folic acid every day if you become pregnant. Ask for birth control (contraception) if you want to prevent pregnancy. Osteoporosis and menopause Osteoporosis is a disease in which the bones lose minerals and strength with aging. This can result in bone fractures. If you are 65 years old or older, or if you are at risk for osteoporosis and fractures, ask your health care provider if you should: Be screened for bone loss. Take a calcium or vitamin D supplement to lower your risk of fractures. Be given hormone replacement therapy (HRT) to treat symptoms of menopause. Follow these instructions at home: Lifestyle Do not use any products that contain nicotine or tobacco, such as cigarettes, e-cigarettes, and chewing tobacco. If you need help quitting, ask your health care provider. Do not use street drugs. Do not share needles. Ask your health care provider for help if you need support or information about quitting drugs. Alcohol use Do not drink alcohol if: Your health care provider tells you not to drink. You are pregnant, may be pregnant, or are planning to become pregnant. If you drink alcohol: Limit how much you use to 0-1 drink a day. Limit intake if you are breastfeeding. Be aware of how much alcohol is in your drink. In the [place] one drink equals one 12 oz bottle of beer (355 mL), one 5 oz glass of wine (148 mL), or one 1 oz glass of hard liquor (44 mL). General instructions Schedule regular health, dental, and eye exams. Stay current with your vaccines. Tell your health care provider if: You often feel depressed. You have ever been abused or do not feel safe at home. Summary Adopting a healthy lifestyle and getting preventive care are important in promoting health and wellness. Follow your health care provider's instructions about healthy diet, exercising, and getting tested or screened for diseases. Follow your health care provider's  instructions on monitoring your cholesterol and blood pressure. This information is not intended to replace advice given to you by your health care provider. Make sure you discuss any questions you have with your health care provider. Document Revised: 07/27/2020 Document Reviewed: 05/12/2018 Elsevier Patient Education  2022 Elsevier Inc.  

## 2021-03-12 LAB — HEPATITIS C ANTIBODY
Hepatitis C Ab: NONREACTIVE
SIGNAL TO CUT-OFF: 0.04 (ref <1.00)

## 2021-04-18 ENCOUNTER — Ambulatory Visit: Payer: BC Managed Care – PPO

## 2021-04-18 ENCOUNTER — Other Ambulatory Visit: Payer: Self-pay

## 2021-05-21 IMAGING — US US OB COMP LESS 14 WK
1 series · 15 of 28 positions shown · non-contrast
Comparison: None.

CLINICAL DATA: Bleeding, cramping

EXAM:
OBSTETRIC <14 WK ULTRASOUND
TECHNIQUE: Transabdominal ultrasound was performed for evaluation of the
gestation as well as the maternal uterus and adnexal regions.

[Series 1: us ob comp less 14 wk · 15 of 37 slices shown]
[im 1/37]
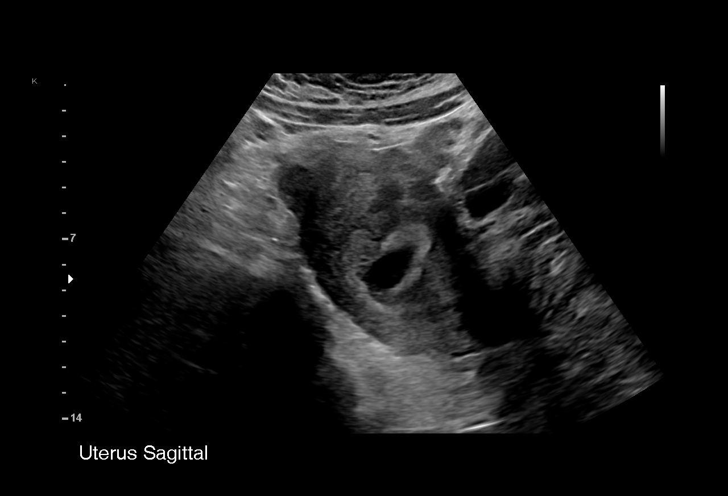
[im 3/37]
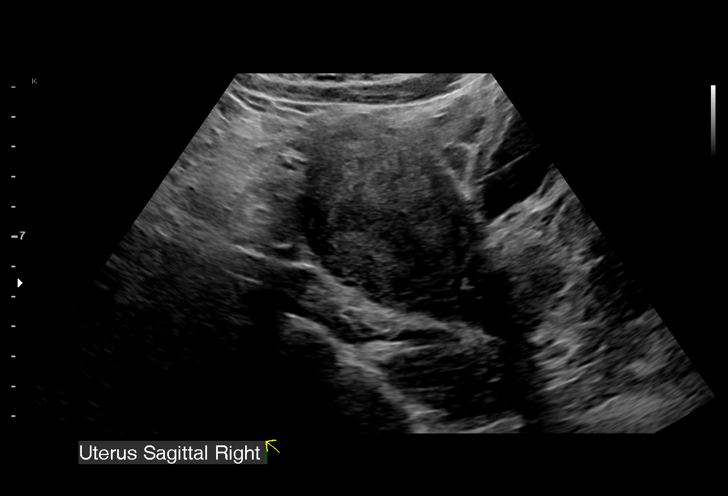
[im 6/37]
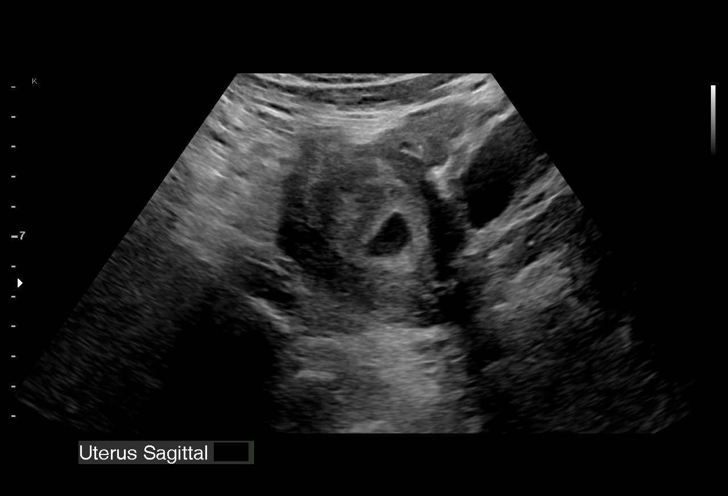
[im 9/37]
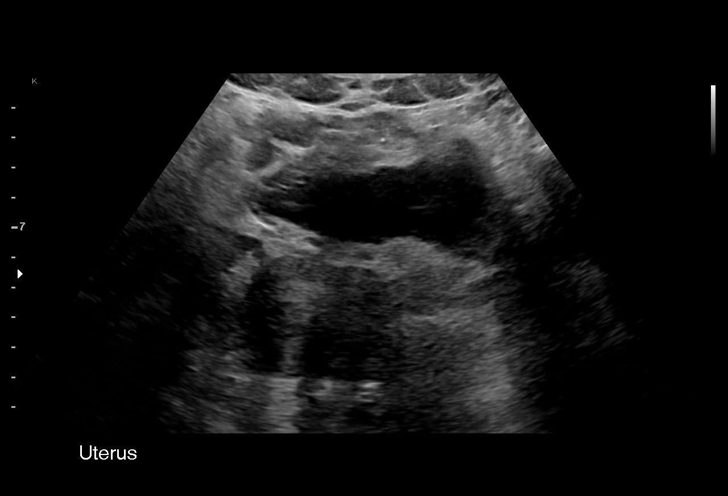
[im 11/37]
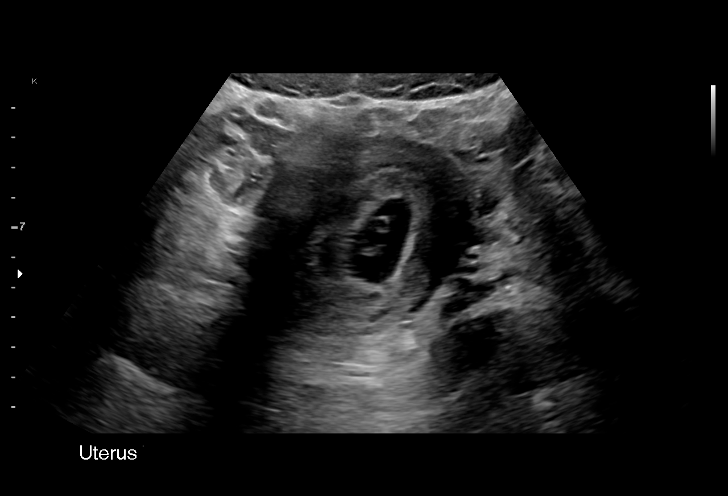
[im 14/37]
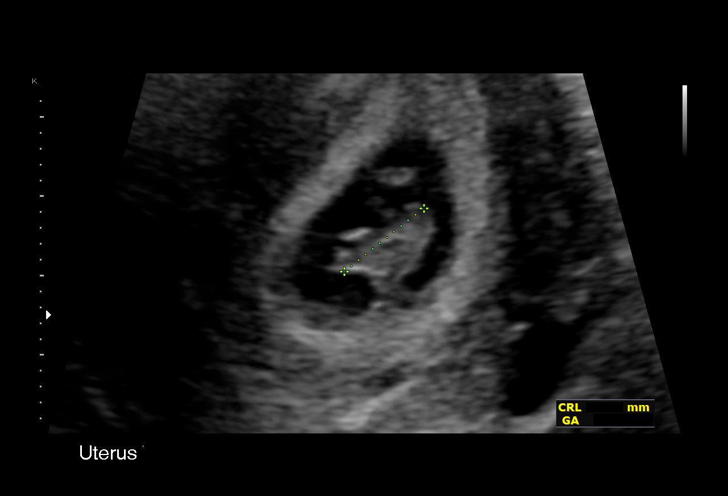
[im 17/37]
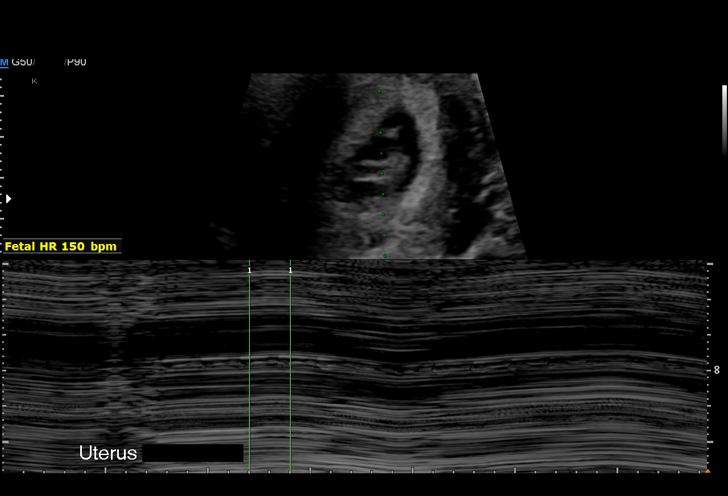
[im 19/37]
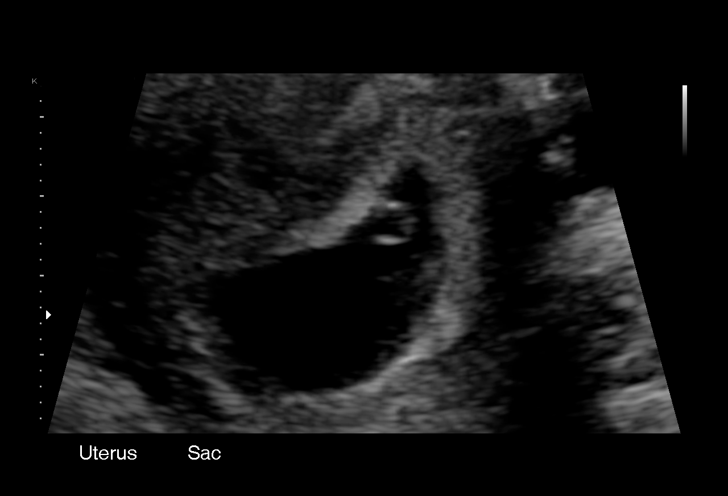
[im 21/37]
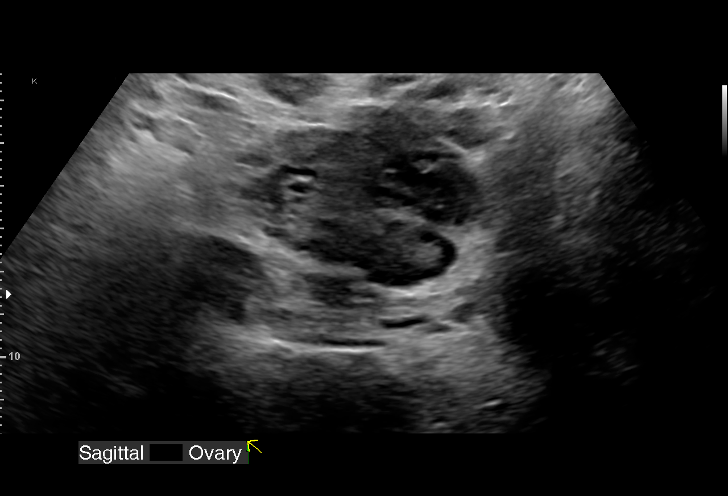
[im 23/37]
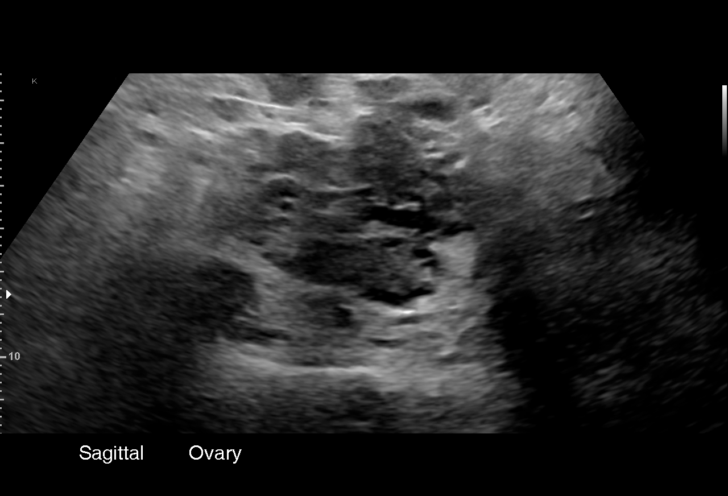
[im 26/37]
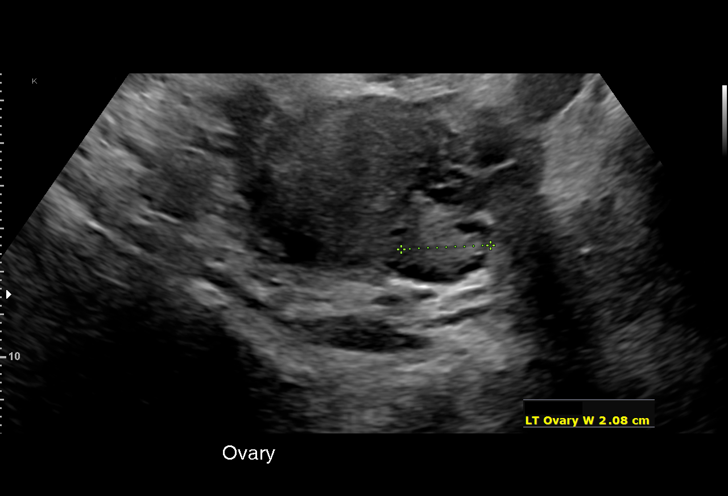
[im 29/37]
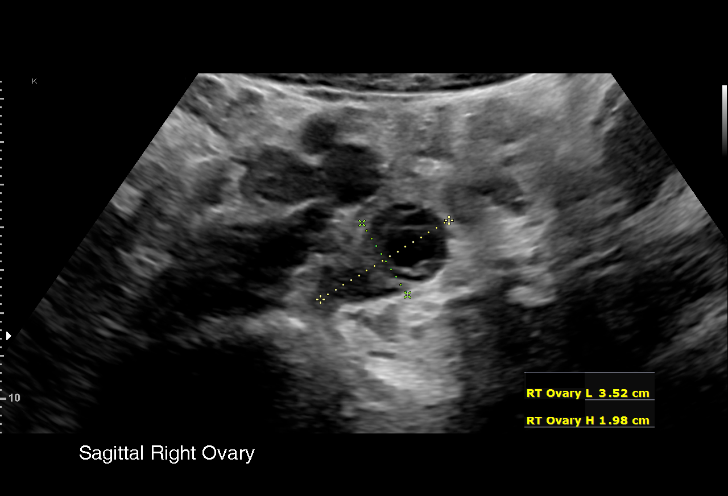
[im 31/37]
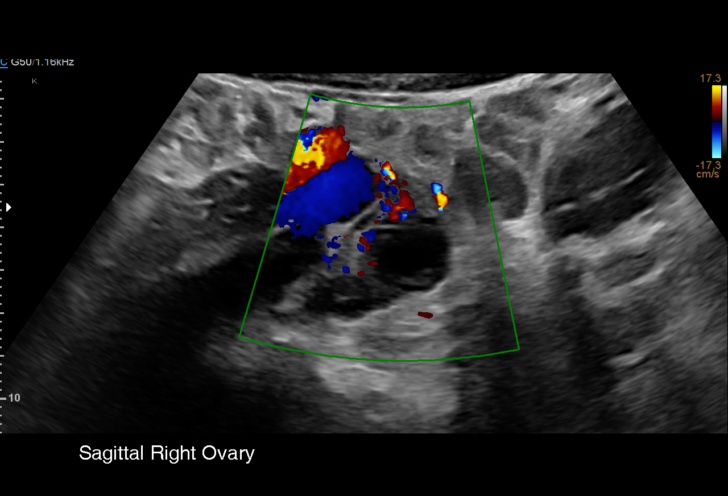
[im 34/37]
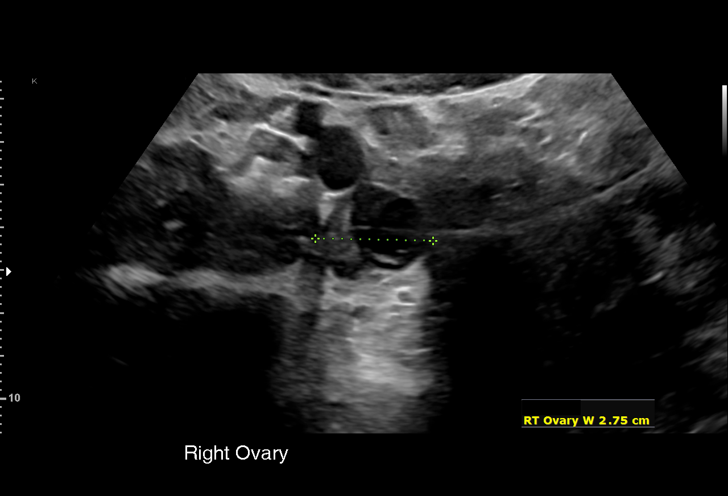
[im 37/37]
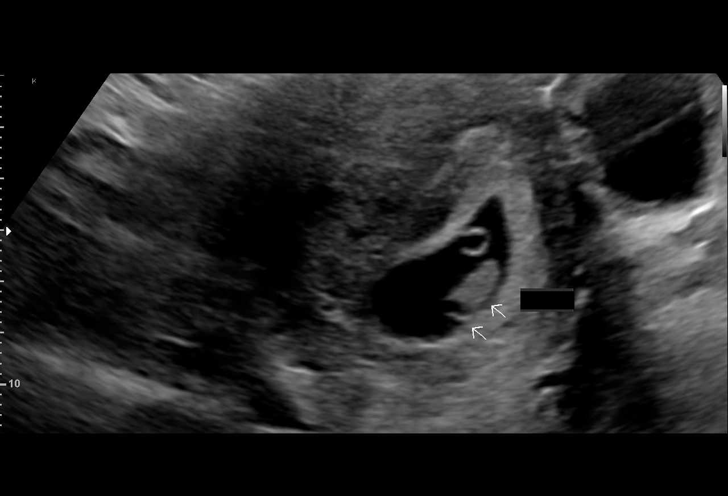

[15 of 28 positions shown; findings below may reference images not displayed]

FINDINGS: Intrauterine gestational sac: Single

Yolk sac:  Visualized

Embryo:  Visualized

Cardiac Activity: Visualized

Heart Rate: 150 bpm

MSD:    mm    w     d

CRL:   14 mm   7 w 5 d                  US EDC: 02/22/2020

Subchorionic hemorrhage:  None visualized.

Maternal uterus/adnexae: No adnexal mass or free fluid.
IMPRESSION: Seven week 5 day intrauterine pregnancy. Fetal heart rate 150 beats
per minute. No acute maternal findings.

## 2021-05-30 ENCOUNTER — Encounter: Payer: BC Managed Care – PPO | Admitting: Emergency Medicine

## 2021-06-06 ENCOUNTER — Ambulatory Visit (INDEPENDENT_AMBULATORY_CARE_PROVIDER_SITE_OTHER): Payer: Medicaid Other

## 2021-06-06 ENCOUNTER — Other Ambulatory Visit (HOSPITAL_COMMUNITY)
Admission: RE | Admit: 2021-06-06 | Discharge: 2021-06-06 | Disposition: A | Discharge status: Home / Self Care | Payer: Medicaid Other | Source: Ambulatory Visit

## 2021-06-06 ENCOUNTER — Other Ambulatory Visit: Payer: Self-pay

## 2021-06-06 VITALS — BP 124/84 | HR 77 | Ht 64.0 in | Wt 248.0 lb

## 2021-06-06 DIAGNOSIS — Z113 Encounter for screening for infections with a predominantly sexual mode of transmission: Secondary | ICD-10-CM

## 2021-06-06 DIAGNOSIS — Z124 Encounter for screening for malignant neoplasm of cervix: Secondary | ICD-10-CM | POA: Insufficient documentation

## 2021-06-06 DIAGNOSIS — Z308 Encounter for other contraceptive management: Secondary | ICD-10-CM | POA: Diagnosis not present

## 2021-06-06 DIAGNOSIS — Z1239 Encounter for other screening for malignant neoplasm of breast: Secondary | ICD-10-CM

## 2021-06-06 DIAGNOSIS — Z01419 Encounter for gynecological examination (general) (routine) without abnormal findings: Secondary | ICD-10-CM | POA: Diagnosis not present

## 2021-06-06 NOTE — Progress Notes (Signed)
Annual, establish care Not sexually active No plans for pregnancy Regular menses not concerns

## 2021-06-06 NOTE — Progress Notes (Signed)
GYNECOLOGY OFFICE VISIT NOTE-WELL WOMAN EXAM  History:   Catherine Mccoy [id number] here today for annual visit. She denies any abnormal vaginal discharge, bleeding, pelvic pain.  She reports LMP 12/28 and was normal which consists of 4-5 day cycle  with light to heavy to light flow. She denies passing of clots or cramping.   Birth Control:  None, No Desires  Reproductive Concerns Partners Type: Female Number of partners in last year: None STD Testing: Full  Vaginal/GU Concerns: No history of abnormal pap smears Breast Concerns/Exams: No issues. SBE when considered, but has breast awareness. 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   Health Maintenance:  No history of pap smears.  No mammogram on file.   Review of Systems:  Pertinent items noted in HPI and remainder of comprehensive ROS otherwise negative.    Objective:    Physical Exam BP 124/84    Pulse 77    Ht 5\' 4"  (1.626 m)    Wt 248 lb (112.5 kg)    LMP 05/29/2021 (Exact Date)    BMI 42.57 kg/m  Physical Exam Vitals reviewed. Exam conducted with a chaperone present.  Constitutional:      General: She is not in acute distress.    Appearance: Normal appearance. She is obese. She is not  toxic-appearing.  HENT:     Head: Normocephalic and atraumatic.  Eyes:     Conjunctiva/sclera: Conjunctivae normal.  Cardiovascular:     Rate and Rhythm: Normal rate and regular rhythm.  Pulmonary:     Effort: Pulmonary effort is normal. No respiratory distress.     Breath sounds: Normal breath sounds.  Chest:  Breasts:    Right: No swelling, nipple discharge, skin change or tenderness.     Left: No swelling, nipple discharge, skin change or tenderness.     Comments: CBE completed Abdominal:     General: Bowel sounds are normal.     Tenderness: There is no abdominal tenderness.  Genitourinary:    Vagina: No tenderness or bleeding.     Cervix: No cervical motion tenderness, discharge, friability or cervical bleeding.     Comments: Pap collected with brush and spatula. BME completed but uterine size and ovaries not appreciated d/t body habitus and patient discomfort.  Musculoskeletal:        General: Normal range of motion.     Cervical back: Normal range of motion. No rigidity.  Skin:    General: Skin is warm and dry.  Neurological:     Mental Status: She is alert and oriented to person, place, and time.  Psychiatric:        Mood and Affect: Mood normal.        Behavior: Behavior normal.        Thought Content: Thought content normal.     Labs and Imaging No results found  for this or any previous visit (from the past 168 hour(s)). No results found.   Assessment & Plan:  31 year old Female Annual Exam Pap Smear Breast Exam Desires STD Testing  1. Well woman exam with routine gynecological exam -Exam performed and findings discussed. -Encouraged to activate and utilize Mychart for reviewing of results, communication with office, and scheduling of appts. -Educated on AHA exercise recommendations of 30 minutes of moderate to vigorous activity at least 5x/week. Commended on current exercise routine and encouraged to increase.   2. Encounter for screening breast  examination -CBE completed today and normal. -Discussed initiation of screening mammograms at 66.  -Educated and encouraged to continue SBE with increased breast awareness including examination of breast for skin changes, moles, tenderness, etc.  -Information given of breast awareness in AVS.   3. Papanicolaou smear for cervical cancer screening -Educated on ASCCP guidelines regarding pap smear evaluation and frequency. -Informed of turnover time and provider/clinic policy on releasing results.  4. Screening examination for STD (sexually transmitted disease) -Desires full testing. -Informed that will treat accordingly. -Results to be released to mychart.    Routine preventative health maintenance measures emphasized. Please refer to After Visit Summary for other counseling recommendations.   No follow-ups on file.      Cherre Robins, CNM 06/06/2021

## 2021-06-07 LAB — HIV ANTIBODY (ROUTINE TESTING W REFLEX): HIV Screen 4th Generation wRfx: NONREACTIVE

## 2021-06-07 LAB — HEPATITIS B SURFACE ANTIGEN: Hepatitis B Surface Ag: NEGATIVE

## 2021-06-07 LAB — HEPATITIS C ANTIBODY: Hep C Virus Ab: <0.1 s/co ratio (ref 0.0–0.9)

## 2021-06-07 LAB — RPR: RPR Ser Ql: NONREACTIVE

## 2021-06-10 LAB — CYTOLOGY - PAP
Chlamydia: NEGATIVE
Comment: NEGATIVE
Comment: NEGATIVE
Comment: NORMAL
Diagnosis: NEGATIVE
High risk HPV: NEGATIVE
Neisseria Gonorrhea: NEGATIVE

## 2021-06-14 ENCOUNTER — Encounter: Payer: Self-pay | Admitting: Emergency Medicine

## 2021-07-11 NOTE — Telephone Encounter (Signed)
Erroneous encounter. Please disregard.

## 2022-11-25 ENCOUNTER — Ambulatory Visit: Payer: BC Managed Care – PPO | Admitting: Emergency Medicine

## 2022-12-11 ENCOUNTER — Ambulatory Visit (INDEPENDENT_AMBULATORY_CARE_PROVIDER_SITE_OTHER): Payer: BC Managed Care – PPO | Admitting: Emergency Medicine

## 2022-12-11 ENCOUNTER — Encounter: Payer: Self-pay | Admitting: Emergency Medicine

## 2022-12-11 VITALS — BP 122/72 | HR 87 | Temp 98.4°F | Ht 64.0 in | Wt 269.1 lb

## 2022-12-11 DIAGNOSIS — Z13228 Encounter for screening for other metabolic disorders: Secondary | ICD-10-CM | POA: Diagnosis not present

## 2022-12-11 DIAGNOSIS — Z13220 Encounter for screening for lipoid disorders: Secondary | ICD-10-CM | POA: Diagnosis not present

## 2022-12-11 DIAGNOSIS — Z0000 Encounter for general adult medical examination without abnormal findings: Secondary | ICD-10-CM

## 2022-12-11 DIAGNOSIS — Z1329 Encounter for screening for other suspected endocrine disorder: Secondary | ICD-10-CM | POA: Diagnosis not present

## 2022-12-11 DIAGNOSIS — Z130 Encounter for screening for diseases of the blood and blood-forming organs and certain disorders involving the immune mechanism: Secondary | ICD-10-CM | POA: Diagnosis not present

## 2022-12-11 LAB — CBC WITH DIFFERENTIAL/PLATELET
Basophils Absolute: 0 10*3/uL (ref 0.0–0.1)
Basophils Relative: 0.3 % (ref 0.0–3.0)
Eosinophils Absolute: 0.1 10*3/uL (ref 0.0–0.7)
Eosinophils Relative: 1.3 % (ref 0.0–5.0)
HCT: 35.5 % — ABNORMAL LOW (ref 36.0–46.0)
Hemoglobin: 11.2 g/dL — ABNORMAL LOW (ref 12.0–15.0)
Lymphocytes Relative: 39.7 % (ref 12.0–46.0)
Lymphs Abs: 3.5 10*3/uL (ref 0.7–4.0)
MCHC: 31.7 g/dL (ref 30.0–36.0)
MCV: 81.4 fl (ref 78.0–100.0)
Monocytes Absolute: 0.6 10*3/uL (ref 0.1–1.0)
Monocytes Relative: 7.1 % (ref 3.0–12.0)
Neutro Abs: 4.6 10*3/uL (ref 1.4–7.7)
Neutrophils Relative %: 51.6 % (ref 43.0–77.0)
Platelets: 409 10*3/uL — ABNORMAL HIGH (ref 150.0–400.0)
RBC: 4.36 Mil/uL (ref 3.87–5.11)
RDW: 15.7 % — ABNORMAL HIGH (ref 11.5–15.5)
WBC: 8.9 10*3/uL (ref 4.0–10.5)

## 2022-12-11 LAB — LIPID PANEL
Cholesterol: 188 mg/dL (ref 0–200)
HDL: 38.7 mg/dL — ABNORMAL LOW (ref >39.00)
LDL Cholesterol: 113 mg/dL — ABNORMAL HIGH (ref 0–99)
NonHDL: 149.36
Total CHOL/HDL Ratio: 5
Triglycerides: 182 mg/dL — ABNORMAL HIGH (ref 0.0–149.0)
VLDL: 36.4 mg/dL (ref 0.0–40.0)

## 2022-12-11 LAB — HEMOGLOBIN A1C: Hgb A1c MFr Bld: 5.7 % (ref 4.6–6.5)

## 2022-12-11 LAB — TSH: TSH: 3.87 u[IU]/mL (ref 0.35–5.50)

## 2022-12-11 LAB — COMPREHENSIVE METABOLIC PANEL
ALT: 14 U/L (ref 0–35)
AST: 17 U/L (ref 0–37)
Albumin: 4 g/dL (ref 3.5–5.2)
Alkaline Phosphatase: 100 U/L (ref 39–117)
BUN: 11 mg/dL (ref 6–23)
CO2: 28 mEq/L (ref 19–32)
Calcium: 9.2 mg/dL (ref 8.4–10.5)
Chloride: 102 mEq/L (ref 96–112)
Creatinine, Ser: 0.82 mg/dL (ref 0.40–1.20)
GFR: 93.96 mL/min (ref >60.00)
Glucose, Bld: 86 mg/dL (ref 70–99)
Potassium: 4.2 mEq/L (ref 3.5–5.1)
Sodium: 135 mEq/L (ref 135–145)
Total Bilirubin: 0.2 mg/dL (ref 0.2–1.2)
Total Protein: 7.8 g/dL (ref 6.0–8.3)

## 2022-12-11 NOTE — Patient Instructions (Signed)

## 2022-12-11 NOTE — Progress Notes (Signed)
Catherine Mccoy 32 y.o.   Chief Complaint  Patient presents with   Annual Exam    No concerns     HISTORY OF PRESENT ILLNESS: This is a 32 y.o. female here for annual exam. No chronic medical problems.  No chronic medications. Concerned about her weight. No other plaints or medical concerns today.  HPI   Prior to Admission medications   Medication Sig Start Date End Date Taking? Authorizing Provider  acetaminophen (TYLENOL) 500 MG tablet Take 500 mg by mouth every 6 (six) hours as needed. Patient not taking: Reported on 03/11/2021    [provider]    No Known Allergies  There are no problems to display for this patient.   Past Medical History:  Diagnosis Date   No pertinent past medical history     Past Surgical History:  Procedure Laterality Date   WISDOM TOOTH EXTRACTION      Social History   Socioeconomic History   Marital status: Single    Spouse name: Not on file   Number of children: Not on file   Years of education: Not on file   Highest education level: Bachelor's degree (e.g., BA, AB, BS)  Occupational History   Not on file  Tobacco Use   Smoking status: Never   Smokeless tobacco: Never  Vaping Use   Vaping status: Never Used  Substance and Sexual Activity   Alcohol use: No   Drug use: Never   Sexual activity: Never    Birth control/protection: None  Other Topics Concern   Not on file  Social History Narrative   Not on file   Social Determinants of Health   Financial Resource Strain: Low Risk  (11/18/2022)   Overall Financial Resource Strain (CARDIA)    Difficulty of Paying Living Expenses: Not hard at all  Food Insecurity: No Food Insecurity (11/18/2022)   Hunger Vital Sign    Worried About Running Out of Food in the Last Year: Never true    Ran Out of Food in the Last Year: Never true  Transportation Needs: No Transportation Needs (11/18/2022)   PRAPARE - Administrator, Civil Service (Medical): No    Lack of  Transportation (Non-Medical): No  Physical Activity: Insufficiently Active (11/18/2022)   Exercise Vital Sign    Days of Exercise per Week: 1 day    Minutes of Exercise per Session: 30 min  Stress: No Stress Concern Present (11/18/2022)   [employer] of Occupational Health - Occupational Stress Questionnaire    Feeling of Stress : Not at all  Social Connections: Unknown (11/18/2022)   Social Connection and Isolation Panel [NHANES]    Frequency of Communication with Friends and Family: More than three times a week    Frequency of Social Gatherings with Friends and Family: Once a week    Attends Religious Services: More than 4 times per year    Active Member of [employer] or Organizations: No    Attends Banker Meetings: Not on file    Marital Status: Patient declined  Intimate Partner Violence: Unknown (09/06/2021)   Received from [facility]   HITS    Physically Hurt: Not on file    Insult or Talk Down To: Not on file    Threaten Physical Harm: Not on file    Scream or Curse: Not on file    Family History  Problem Relation Age of Onset   Hyperlipidemia Mother    Healthy Mother    Hyperlipidemia Father  Healthy Father    Healthy Sister    Healthy Brother      Review of Systems  Constitutional: Negative.  Negative for chills and fever.  HENT: Negative.  Negative for congestion and sore throat.   Respiratory: Negative.  Negative for cough and shortness of breath.   Cardiovascular: Negative.  Negative for chest pain and palpitations.  Gastrointestinal:  Negative for abdominal pain, diarrhea, nausea and vomiting.  Genitourinary: Negative.  Negative for dysuria and hematuria.  Skin: Negative.  Negative for rash.  Neurological: Negative.  Negative for dizziness and headaches.  All other systems reviewed and are negative.   Vitals:   12/11/22 1533  BP: 122/72  Pulse: 87  Temp: 98.4 F (36.9 C)  SpO2: 98%    Physical Exam Vitals reviewed.   Constitutional:      Appearance: Normal appearance. She is obese.  HENT:     Head: Normocephalic.     Right Ear: Tympanic membrane, ear canal and external ear normal.     Left Ear: Tympanic membrane, ear canal and external ear normal.     Mouth/Throat:     Mouth: Mucous membranes are moist.     Pharynx: Oropharynx is clear.  Eyes:     Extraocular Movements: Extraocular movements intact.     Conjunctiva/sclera: Conjunctivae normal.     Pupils: Pupils are equal, round, and reactive to light.  Cardiovascular:     Rate and Rhythm: Normal rate and regular rhythm.     Pulses: Normal pulses.     Heart sounds: Normal heart sounds.  Pulmonary:     Effort: Pulmonary effort is normal.     Breath sounds: Normal breath sounds.  Abdominal:     Palpations: Abdomen is soft.     Tenderness: There is no abdominal tenderness.  Musculoskeletal:     Cervical back: No tenderness.  Lymphadenopathy:     Cervical: No cervical adenopathy.  Skin:    General: Skin is warm and dry.     Capillary Refill: Capillary refill takes less than 2 seconds.  Neurological:     General: No focal deficit present.     Mental Status: She is alert and oriented to person, place, and time.  Psychiatric:        Mood and Affect: Mood normal.        Behavior: Behavior normal.      ASSESSMENT & PLAN: Problem List Items Addressed This Visit   None Visit Diagnoses     Routine general medical examination at a health care facility    -  Primary   Relevant Orders   CBC with Differential/Platelet   Comprehensive metabolic panel   Lipid panel   Screening for deficiency anemia       Relevant Orders   CBC with Differential/Platelet   Screening for lipoid disorders       Relevant Orders   Lipid panel   Screening for endocrine, metabolic and immunity disorder       Relevant Orders   Comprehensive metabolic panel   TSH   Hemoglobin A1c      Modifiable risk factors discussed with patient. Anticipatory guidance  according to age provided. The following topics were also discussed: Social Determinants of Health Smoking.  Non-smoker Diet and nutrition and need to decrease amount of daily carbohydrate intake and daily calories and increase amount of plant-based protein in her diet Benefits of exercise Cancer family history review Vaccinations reviewed and recommendations Cardiovascular risk assessment and need for blood work Mental  health including depression and anxiety Fall and accident prevention  Patient Instructions  Health Maintenance, Female Adopting a healthy lifestyle and getting preventive care are important in promoting health and wellness. Ask your health care provider about: The right schedule for you to have regular tests and exams. Things you can do on your own to prevent diseases and keep yourself healthy. What should I know about diet, weight, and exercise? Eat a healthy diet  Eat a diet that includes plenty of vegetables, fruits, low-fat dairy products, and lean protein. Do not eat a lot of foods that are high in solid fats, added sugars, or sodium. Maintain a healthy weight Body mass index (BMI) is used to identify weight problems. It estimates body fat based on height and weight. Your health care provider can help determine your BMI and help you achieve or maintain a healthy weight. Get regular exercise Get regular exercise. This is one of the most important things you can do for your health. Most adults should: Exercise for at least 150 minutes each week. The exercise should increase your heart rate and make you sweat (moderate-intensity exercise). Do strengthening exercises at least twice a week. This is in addition to the moderate-intensity exercise. Spend less time sitting. Even light physical activity can be beneficial. Watch cholesterol and blood lipids Have your blood tested for lipids and cholesterol at 33 years of age, then have this test every 5 years. Have your  cholesterol levels checked more often if: Your lipid or cholesterol levels are high. You are older than 33 years of age. You are at high risk for heart disease. What should I know about cancer screening? Depending on your health history and family history, you may need to have cancer screening at various ages. This may include screening for: Breast cancer. Cervical cancer. Colorectal cancer. Skin cancer. Lung cancer. What should I know about heart disease, diabetes, and high blood pressure? Blood pressure and heart disease High blood pressure causes heart disease and increases the risk of stroke. This is more likely to develop in people who have high blood pressure readings or are overweight. Have your blood pressure checked: Every 3-5 years if you are 80-33 years of age. Every year if you are 61 years old or older. Diabetes Have regular diabetes screenings. This checks your fasting blood sugar level. Have the screening done: Once every three years after age 35 if you are at a normal weight and have a low risk for diabetes. More often and at a younger age if you are overweight or have a high risk for diabetes. What should I know about preventing infection? Hepatitis B If you have a higher risk for hepatitis B, you should be screened for this virus. Talk with your health care provider to find out if you are at risk for hepatitis B infection. Hepatitis C Testing is recommended for: Everyone born from [birth year] through 1966. Anyone with known risk factors for hepatitis C. Sexually transmitted infections (STIs) Get screened for STIs, including gonorrhea and chlamydia, if: You are sexually active and are younger than 33 years of age. You are older than 33 years of age and your health care provider tells you that you are at risk for this type of infection. Your sexual activity has changed since you were last screened, and you are at increased risk for chlamydia or gonorrhea. Ask your health care  provider if you are at risk. Ask your health care provider about whether you are at high risk for  HIV. Your health care provider may recommend a prescription medicine to help prevent HIV infection. If you choose to take medicine to prevent HIV, you should first get tested for HIV. You should then be tested every 3 months for as long as you are taking the medicine. Pregnancy If you are about to stop having your period (premenopausal) and you may become pregnant, seek counseling before you get pregnant. Take 400 to 800 micrograms (mcg) of folic acid every day if you become pregnant. Ask for birth control (contraception) if you want to prevent pregnancy. Osteoporosis and menopause Osteoporosis is a disease in which the bones lose minerals and strength with aging. This can result in bone fractures. If you are 22 years old or older, or if you are at risk for osteoporosis and fractures, ask your health care provider if you should: Be screened for bone loss. Take a calcium or vitamin D supplement to lower your risk of fractures. Be given hormone replacement therapy (HRT) to treat symptoms of menopause. Follow these instructions at home: Alcohol use Do not drink alcohol if: Your health care provider tells you not to drink. You are pregnant, may be pregnant, or are planning to become pregnant. If you drink alcohol: Limit how much you have to: 0-1 drink a day. Know how much alcohol is in your drink. In the [place] one drink equals one 12 oz bottle of beer (355 mL), one 5 oz glass of wine (148 mL), or one 1 oz glass of hard liquor (44 mL). Lifestyle Do not use any products that contain nicotine or tobacco. These products include cigarettes, chewing tobacco, and vaping devices, such as e-cigarettes. If you need help quitting, ask your health care provider. Do not use street drugs. Do not share needles. Ask your health care provider for help if you need support or information about quitting drugs. General  instructions Schedule regular health, dental, and eye exams. Stay current with your vaccines. Tell your health care provider if: You often feel depressed. You have ever been abused or do not feel safe at home. Summary Adopting a healthy lifestyle and getting preventive care are important in promoting health and wellness. Follow your health care provider's instructions about healthy diet, exercising, and getting tested or screened for diseases. Follow your health care provider's instructions on monitoring your cholesterol and blood pressure. This information is not intended to replace advice given to you by your health care provider. Make sure you discuss any questions you have with your health care provider. Document Revised: 10/08/2020 Document Reviewed: 10/08/2020 Elsevier Patient Education  2024 Elsevier Inc.     Edwina Barth, MD  Primary Care at [facility]

## 2022-12-18 ENCOUNTER — Encounter: Payer: Self-pay | Admitting: Student

## 2022-12-18 ENCOUNTER — Ambulatory Visit: Payer: BC Managed Care – PPO | Admitting: Student

## 2022-12-18 ENCOUNTER — Other Ambulatory Visit (HOSPITAL_COMMUNITY)
Admission: RE | Admit: 2022-12-18 | Discharge: 2022-12-18 | Disposition: A | Discharge status: Home / Self Care | Payer: BC Managed Care – PPO | Source: Ambulatory Visit | Attending: Student | Admitting: Student

## 2022-12-18 VITALS — BP 130/82 | HR 82 | Ht 64.0 in | Wt 263.6 lb

## 2022-12-18 DIAGNOSIS — N907 Vulvar cyst: Secondary | ICD-10-CM | POA: Diagnosis not present

## 2022-12-18 DIAGNOSIS — Z01419 Encounter for gynecological examination (general) (routine) without abnormal findings: Secondary | ICD-10-CM

## 2022-12-18 DIAGNOSIS — Z1339 Encounter for screening examination for other mental health and behavioral disorders: Secondary | ICD-10-CM

## 2022-12-18 NOTE — Progress Notes (Signed)
ANNUAL EXAM Patient name: Catherine Mccoy MRN [id number]  Date of birth: [birth year] Chief Complaint:   Skin Problem and Gynecologic Exam  History of Present Illness:   Catherine Mccoy is a 32 y.o. G0P0000 African-American female being seen today for a routine annual exam.  Current complaints: Vaginal Bumps that presented just about the time of her LMP. Patient reports that she noticed some bumps after changing her peripad that started with her LMP. States the bumps are non-painful or itchy. Has not tried any at home remedies and is not sexually active.  Patient's last menstrual period was 12/10/2022. Regular cycles.   Upstream - 12/18/22 0836       Pregnancy Intention Screening   Does the patient want to become pregnant in the next year? No    Does the patient's partner want to become pregnant in the next year? No    Would the patient like to discuss contraceptive options today? No            The pregnancy intention screening data noted above was reviewed. Potential methods of contraception were discussed. The patient elected to proceed with No data recorded.   Last pap 06/2021. Results were: NILM w/ HRHPV negative. H/O abnormal pap: no Last mammogram: n/a. Results were: N/A. Family h/o breast cancer: no Last colonoscopy: n/a. Results were: N/A. Family h/o colorectal cancer: no     12/18/2022    8:36 AM 12/11/2022    3:33 PM 06/06/2021   10:03 AM 03/11/2021    1:07 PM 01/24/2019    4:41 PM  Depression screen PHQ 2/9  Decreased Interest 0 0 3 0 0  Down, Depressed, Hopeless 0 0 0 0 0  PHQ - 2 Score 0 0 3 0 0  Altered sleeping 0  0    Tired, decreased energy 0  0    Change in appetite 0  0    Feeling bad or failure about yourself  0  0    Trouble concentrating 0  0    Moving slowly or fidgety/restless 0  0    Suicidal thoughts 0  0    PHQ-9 Score 0  3          12/18/2022    8:36 AM 06/06/2021   10:05 AM  GAD 7 : Generalized Anxiety Score  Nervous, Anxious, on Edge 0 0   Control/stop worrying 0 0  Worry too much - different things 0 0  Trouble relaxing 0 0  Restless 0 0  Easily annoyed or irritable 0 0  Afraid - awful might happen 0 0  Total GAD 7 Score 0 0     Review of Systems:   Pertinent items are noted in HPI Denies any headaches, blurred vision, fatigue, shortness of breath, chest pain, abdominal pain, abnormal vaginal discharge/itching/odor/irritation, problems with periods, bowel movements, urination, or intercourse unless otherwise stated above. Pertinent History Reviewed:  Reviewed past medical,surgical, social and family history.  Reviewed problem list, medications and allergies. Physical Assessment:   Vitals:   12/18/22 0832  BP: 130/82  Pulse: 82  Weight: 263 lb 9.6 oz (119.6 kg)  Height: 5\' 4"  (1.626 m)  Body mass index is 45.25 kg/m.        Physical Examination:   General appearance - well appearing, and in no distress  Mental status - alert, oriented to person, place, and time  Psych:  She has a normal mood and affect  Skin - warm and dry, normal color, no suspicious lesions  noted  Chest - effort normal, all lung fields clear to auscultation bilaterally  Heart - normal rate and regular rhythm  Neck:  midline trachea, no thyromegaly or nodules  Abdomen - soft, nontender, nondistended, no masses or organomegaly  Pelvic - VULVA: normal appearing vulva with no masses or tenderness.  VAGINA: normal appearing vagina with normal color and discharge, Small cluster of cystic nodules bilaterally, at lower entrance of vagina, <11mm in size, no pus, redness, or pain on exam CERVIX: normal appearing cervix without discharge or lesions, no CMT  Thin prep pap is not done   Extremities:  No swelling or varicosities noted  Chaperone present for exam  No results found for this or any previous visit (from the past 24 hour(s)).  Assessment & Plan:  1. Women's annual routine gynecological examination - Cervicovaginal ancillary only( [facility]) - HIV antibody (with reflex) - RPR - Hepatitis B Surface AntiGEN - Hepatitis C Antibody  2. Sebaceous cyst of labia - Consulted Dr. Alysia Penna for confirmation of diagnosis. Dr. Alysia Penna counseled patient that lesions are benign and patient may schedule a follow-up visit to have bumps incised. Patient verbalizes interest in these next steps. Encouraged to call office and make appointment when patient is ready.   Labs/procedures today:   Mammogram: @ 32yo, or sooner if problems Colonoscopy: @ 32yo, or sooner if problems  Orders Placed This Encounter  Procedures   HIV antibody (with reflex)   RPR   Hepatitis B Surface AntiGEN   Hepatitis C Antibody    Meds: No orders of the defined types were placed in this encounter.   Follow-up: Return in about 1 year (around 12/18/2023), or if symptoms worsen or fail to improve, for ANN.  Corlis Hove, NP 12/18/2022 11:36 AM

## 2022-12-18 NOTE — Progress Notes (Signed)
Pt presents to establish care. Last PAP 06/2021 Requesting STD testing  Declines BC Pt reports pain and swelling in the legs. Pt reports bumps on the vagina

## 2022-12-19 LAB — CERVICOVAGINAL ANCILLARY ONLY
Bacterial Vaginitis (gardnerella): NEGATIVE
Candida Glabrata: NEGATIVE
Candida Vaginitis: NEGATIVE
Chlamydia: NEGATIVE
Comment: NEGATIVE
Comment: NEGATIVE
Comment: NEGATIVE
Comment: NEGATIVE
Comment: NEGATIVE
Comment: NORMAL
Neisseria Gonorrhea: NEGATIVE
Trichomonas: NEGATIVE

## 2022-12-20 LAB — HEPATITIS C ANTIBODY: Hep C Virus Ab: NONREACTIVE

## 2022-12-20 LAB — HEPATITIS B SURFACE ANTIGEN: Hepatitis B Surface Ag: NEGATIVE

## 2022-12-20 LAB — RPR: RPR Ser Ql: NONREACTIVE

## 2022-12-20 LAB — HIV ANTIBODY (ROUTINE TESTING W REFLEX): HIV Screen 4th Generation wRfx: NONREACTIVE

## 2022-12-24 ENCOUNTER — Encounter: Payer: Self-pay | Admitting: Obstetrics and Gynecology

## 2022-12-24 ENCOUNTER — Ambulatory Visit (INDEPENDENT_AMBULATORY_CARE_PROVIDER_SITE_OTHER): Payer: BC Managed Care – PPO | Admitting: Obstetrics and Gynecology

## 2022-12-24 VITALS — BP 125/84 | HR 79 | Wt 264.4 lb

## 2022-12-24 DIAGNOSIS — L728 Other follicular cysts of the skin and subcutaneous tissue: Secondary | ICD-10-CM | POA: Diagnosis not present

## 2022-12-24 DIAGNOSIS — N907 Vulvar cyst: Secondary | ICD-10-CM

## 2022-12-24 NOTE — Progress Notes (Signed)
Catherine Mccoy for evacuation of several small epidermal cyts on her left labia  Procedure: After informed consent. Area was sprayed with Huriccane spray. Each epidermal cyst was the unroofed with an 18 gauge needle and contents expressed. Small hemostat was used to removed cyst wall. AgNo3 applied to each site Pt tolerated well. Wound care reviewed F/U PRN

## 2022-12-24 NOTE — Progress Notes (Signed)
Pt in office for follow up labial cyst, ?drain.

## 2023-04-29 ENCOUNTER — Ambulatory Visit (INDEPENDENT_AMBULATORY_CARE_PROVIDER_SITE_OTHER): Payer: BC Managed Care – PPO

## 2023-04-29 ENCOUNTER — Ambulatory Visit: Payer: BC Managed Care – PPO | Admitting: Emergency Medicine

## 2023-04-29 ENCOUNTER — Encounter: Payer: Self-pay | Admitting: Emergency Medicine

## 2023-04-29 VITALS — BP 122/74 | HR 87 | Temp 98.8°F | Ht 64.0 in | Wt 266.0 lb

## 2023-04-29 DIAGNOSIS — M25572 Pain in left ankle and joints of left foot: Secondary | ICD-10-CM

## 2023-04-29 DIAGNOSIS — S93402A Sprain of unspecified ligament of left ankle, initial encounter: Secondary | ICD-10-CM | POA: Diagnosis not present

## 2023-04-29 MED ORDER — MELOXICAM 15 MG PO TABS: 15.0000 mg | ORAL_TABLET | Freq: Every day | ORAL | 0 refills | Status: AC

## 2023-04-29 NOTE — Assessment & Plan Note (Signed)
Recommend x-ray today. Mild sprain.  Clinically stable. Related to gym activities Acute inflammation.  Recommend meloxicam 15 mg daily for 10 days

## 2023-04-29 NOTE — Assessment & Plan Note (Signed)
Most likely related to increased gym activity and exercise Recommend meloxicam 15 mg daily for 10 days Importance of stretching pre and post exercise discussed Importance of warming up before exercising discussed Pain management discussed

## 2023-04-29 NOTE — Progress Notes (Signed)
Gardner Candle 32 y.o.   Chief Complaint  Patient presents with   Ankle Pain    Patient states she woke up to a sharp pain on her ankle. When she's sleeping is when it hurts the most. No numbness, no swelling, no injury to left ankle     HISTORY OF PRESENT ILLNESS: This is a 32 y.o. female complaining of pain to left ankle area for the past couple weeks Just recently returned to the gym.  Uses treadmill and leg machines. No other complaints or medical concerns today.  Ankle Pain      Prior to Admission medications   Not on File    No Known Allergies  There are no problems to display for this patient.   Past Medical History:  Diagnosis Date   No pertinent past medical history     Past Surgical History:  Procedure Laterality Date   WISDOM TOOTH EXTRACTION      Social History   Socioeconomic History   Marital status: Single    Spouse name: Not on file   Number of children: Not on file   Years of education: Not on file   Highest education level: Bachelor's degree (e.g., BA, AB, BS)  Occupational History   Not on file  Tobacco Use   Smoking status: Never   Smokeless tobacco: Never  Vaping Use   Vaping status: Never Used  Substance and Sexual Activity   Alcohol use: No   Drug use: Never   Sexual activity: Never    Birth control/protection: None  Other Topics Concern   Not on file  Social History Narrative   Not on file   Social Determinants of Health   Financial Resource Strain: Low Risk  (11/18/2022)   Overall Financial Resource Strain (CARDIA)    Difficulty of Paying Living Expenses: Not hard at all  Food Insecurity: No Food Insecurity (11/18/2022)   Hunger Vital Sign    Worried About Running Out of Food in the Last Year: Never true    Ran Out of Food in the Last Year: Never true  Transportation Needs: No Transportation Needs (11/18/2022)   PRAPARE - Administrator, Civil Service (Medical): No    Lack of Transportation (Non-Medical): No   Physical Activity: Insufficiently Active (11/18/2022)   Exercise Vital Sign    Days of Exercise per Week: 1 day    Minutes of Exercise per Session: 30 min  Stress: No Stress Concern Present (11/18/2022)   [employer] of Occupational Health - Occupational Stress Questionnaire    Feeling of Stress : Not at all  Social Connections: Unknown (11/18/2022)   Social Connection and Isolation Panel [NHANES]    Frequency of Communication with Friends and Family: More than three times a week    Frequency of Social Gatherings with Friends and Family: Once a week    Attends Religious Services: More than 4 times per year    Active Member of [employer] or Organizations: No    Attends Banker Meetings: Not on file    Marital Status: Patient declined  Intimate Partner Violence: Unknown (09/06/2021)   Received from [employer], [facility]   HITS    Physically Hurt: Not on file    Insult or Talk Down To: Not on file    Threaten Physical Harm: Not on file    Scream or Curse: Not on file    Family History  Problem Relation Age of Onset   Hyperlipidemia Mother  Healthy Mother    Hyperlipidemia Father    Healthy Father    Healthy Sister    Healthy Brother      Review of Systems  Constitutional: Negative.  Negative for chills and fever.  HENT: Negative.  Negative for congestion and sore throat.   Respiratory: Negative.  Negative for cough and shortness of breath.   Cardiovascular: Negative.  Negative for chest pain and palpitations.  Gastrointestinal:  Negative for abdominal pain, nausea and vomiting.  Genitourinary: Negative.   Skin: Negative.  Negative for rash.  Neurological: Negative.  Negative for dizziness and headaches.  All other systems reviewed and are negative.   Vitals:   04/29/23 0858  BP: 122/74  Pulse: 87  Temp: 98.8 F (37.1 C)  SpO2: 99%    Physical Exam Vitals reviewed.  Constitutional:      Appearance: Normal appearance. She is obese.  HENT:      Head: Normocephalic.  Eyes:     Extraocular Movements: Extraocular movements intact.  Cardiovascular:     Rate and Rhythm: Normal rate.  Pulmonary:     Effort: Pulmonary effort is normal.  Musculoskeletal:     Comments: Left ankle: No significant swelling or tenderness.  Full range of motion.  No erythema or ecchymosis. Left foot: Warm to touch.  Good peripheral pulses and capillary refill.  No abnormal findings.  Skin:    General: Skin is warm and dry.  Neurological:     Mental Status: She is alert and oriented to person, place, and time.  Psychiatric:        Mood and Affect: Mood normal.        Behavior: Behavior normal.    DG Ankle Complete Left  Result Date: 04/29/2023 CLINICAL DATA:  Left ankle pain for 1.5 weeks.  No known injury. EXAM: LEFT ANKLE COMPLETE - 3+ VIEW COMPARISON:  Left ankle radiographs 04/06/2017 FINDINGS: Normal bone mineralization. The ankle mortise is symmetric and intact. Mild chronic enthesopathic change at the Achilles insertion on the calcaneus. No acute fracture is seen. No dislocation. Mild medial greater than lateral malleolar soft tissue swelling, new from prior. IMPRESSION: Mild medial greater than lateral malleolar soft tissue swelling, new from prior. No acute fracture. Electronically Signed   By: Neita Garnet M.D.   On: 04/29/2023 10:21     ASSESSMENT & PLAN: A total of 33 minutes was spent with the patient and counseling/coordination of care regarding preparing for this visit, review of most recent office visit notes, review of x-ray report done today, differential diagnosis of ankle pain, pain management, prognosis, documentation and need for follow-up.  Problem List Items Addressed This Visit       Musculoskeletal and Integument   Sprain of left ankle    Recommend x-ray today. Mild sprain.  Clinically stable. Related to gym activities Acute inflammation.  Recommend meloxicam 15 mg daily for 10 days      Relevant Medications   meloxicam  (MOBIC) 15 MG tablet   Other Relevant Orders   DG Ankle Complete Left     Other   Acute left ankle pain - Primary    Most likely related to increased gym activity and exercise Recommend meloxicam 15 mg daily for 10 days Importance of stretching pre and post exercise discussed Importance of warming up before exercising discussed Pain management discussed      Relevant Medications   meloxicam (MOBIC) 15 MG tablet   Other Relevant Orders   DG Ankle Complete Left   Patient  Instructions  Ankle Pain The ankle joint helps you stand on your leg and allows you to move around. Ankle pain can happen on either side or the back of the ankle. You may have pain in one ankle or both ankles. Ankle pain may be sharp and burning or dull and aching. There may be tenderness, stiffness, redness, or warmth around the ankle. Many things can cause ankle pain. These include an injury to the area and overuse of your ankle. Follow these instructions at home: Activity Rest your ankle as told by your health care provider. Avoid doing things that cause ankle pain. Do not use the injured limb to support your body weight until your provider says that you can. Use crutches as told by your provider. Ask your provider when it is safe to drive if you have a brace on your ankle. Do exercises as told by your provider. If you have a removable brace: Wear the brace as told by your provider. Remove it only as told by your provider. Check the skin around the brace every day. Tell your provider about any concerns. Loosen the brace if your toes tingle, become numb, or turn cold and blue. Keep the brace clean. If the brace is not waterproof: Do not let it get wet. Cover it with a watertight covering when you take a bath or shower. If you have an elastic bandage:  Remove it when you take a bath or a shower. Try not to move your ankle much. Wiggle your toes from time to time. This helps to prevent swelling. Adjust the  bandage if it feels too tight. Loosen the bandage if your foot tingles, becomes numb, or turns cold and blue. Managing pain, stiffness, and swelling  If told, put ice on the painful area. If you have a removable brace or elastic bandage, remove it as told by your provider. Put ice in a plastic bag. Place a towel between your skin and the bag. Leave the ice on for 20 minutes, 2-3 times a day. If your skin turns bright red, remove the ice right away to prevent skin damage. The risk of damage is higher if you cannot feel pain, heat, or cold. Move your toes often to reduce stiffness and swelling. Raise (elevate) your ankle above the level of your heart while you are sitting or lying down. General instructions Take over-the-counter and prescription medicines only as told by your provider. To help you and your provider, write down: How often you have ankle pain. Where the pain is. What the pain feels like. If you are told to wear a certain shoe or insole, make sure you wear it the right way and for as long as you are told. Contact a health care provider if: Your pain gets worse. Your pain does not get better with medicine. You have a fever or chills. You have more trouble walking. You have new symptoms. Your foot, leg, toes, or ankle tingles, becomes numb or swollen, or turns cold and blue. This information is not intended to replace advice given to you by your health care provider. Make sure you discuss any questions you have with your health care provider. Document Revised: 03/12/2022 Document Reviewed: 03/12/2022 Elsevier Patient Education  2024 Elsevier Inc.    Problem List Items Addressed This Visit       Musculoskeletal and Integument   Sprain of left ankle    Recommend x-ray today. Mild sprain.  Clinically stable. Related to gym activities Acute inflammation.  Recommend  meloxicam 15 mg daily for 10 days      Relevant Medications   meloxicam (MOBIC) 15 MG tablet   Other  Relevant Orders   DG Ankle Complete Left     Other   Acute left ankle pain - Primary    Most likely related to increased gym activity and exercise Recommend meloxicam 15 mg daily for 10 days Importance of stretching pre and post exercise discussed Importance of warming up before exercising discussed Pain management discussed      Relevant Medications   meloxicam (MOBIC) 15 MG tablet   Other Relevant Orders   DG Ankle Complete Left   Patient Instructions  Ankle Pain The ankle joint helps you stand on your leg and allows you to move around. Ankle pain can happen on either side or the back of the ankle. You may have pain in one ankle or both ankles. Ankle pain may be sharp and burning or dull and aching. There may be tenderness, stiffness, redness, or warmth around the ankle. Many things can cause ankle pain. These include an injury to the area and overuse of your ankle. Follow these instructions at home: Activity Rest your ankle as told by your health care provider. Avoid doing things that cause ankle pain. Do not use the injured limb to support your body weight until your provider says that you can. Use crutches as told by your provider. Ask your provider when it is safe to drive if you have a brace on your ankle. Do exercises as told by your provider. If you have a removable brace: Wear the brace as told by your provider. Remove it only as told by your provider. Check the skin around the brace every day. Tell your provider about any concerns. Loosen the brace if your toes tingle, become numb, or turn cold and blue. Keep the brace clean. If the brace is not waterproof: Do not let it get wet. Cover it with a watertight covering when you take a bath or shower. If you have an elastic bandage:  Remove it when you take a bath or a shower. Try not to move your ankle much. Wiggle your toes from time to time. This helps to prevent swelling. Adjust the bandage if it feels too  tight. Loosen the bandage if your foot tingles, becomes numb, or turns cold and blue. Managing pain, stiffness, and swelling  If told, put ice on the painful area. If you have a removable brace or elastic bandage, remove it as told by your provider. Put ice in a plastic bag. Place a towel between your skin and the bag. Leave the ice on for 20 minutes, 2-3 times a day. If your skin turns bright red, remove the ice right away to prevent skin damage. The risk of damage is higher if you cannot feel pain, heat, or cold. Move your toes often to reduce stiffness and swelling. Raise (elevate) your ankle above the level of your heart while you are sitting or lying down. General instructions Take over-the-counter and prescription medicines only as told by your provider. To help you and your provider, write down: How often you have ankle pain. Where the pain is. What the pain feels like. If you are told to wear a certain shoe or insole, make sure you wear it the right way and for as long as you are told. Contact a health care provider if: Your pain gets worse. Your pain does not get better with medicine. You have a fever  or chills. You have more trouble walking. You have new symptoms. Your foot, leg, toes, or ankle tingles, becomes numb or swollen, or turns cold and blue. This information is not intended to replace advice given to you by your health care provider. Make sure you discuss any questions you have with your health care provider. Document Revised: 03/12/2022 Document Reviewed: 03/12/2022 Elsevier Patient Education  2024 Elsevier Inc.    Edwina Barth, MD [facility]

## 2023-04-29 NOTE — Patient Instructions (Signed)
Ankle Pain The ankle joint helps you stand on your leg and allows you to move around. Ankle pain can happen on either side or the back of the ankle. You may have pain in one ankle or both ankles. Ankle pain may be sharp and burning or dull and aching. There may be tenderness, stiffness, redness, or warmth around the ankle. Many things can cause ankle pain. These include an injury to the area and overuse of your ankle. Follow these instructions at home: Activity Rest your ankle as told by your health care provider. Avoid doing things that cause ankle pain. Do not use the injured limb to support your body weight until your provider says that you can. Use crutches as told by your provider. Ask your provider when it is safe to drive if you have a brace on your ankle. Do exercises as told by your provider. If you have a removable brace: Wear the brace as told by your provider. Remove it only as told by your provider. Check the skin around the brace every day. Tell your provider about any concerns. Loosen the brace if your toes tingle, become numb, or turn cold and blue. Keep the brace clean. If the brace is not waterproof: Do not let it get wet. Cover it with a watertight covering when you take a bath or shower. If you have an elastic bandage:  Remove it when you take a bath or a shower. Try not to move your ankle much. Wiggle your toes from time to time. This helps to prevent swelling. Adjust the bandage if it feels too tight. Loosen the bandage if your foot tingles, becomes numb, or turns cold and blue. Managing pain, stiffness, and swelling  If told, put ice on the painful area. If you have a removable brace or elastic bandage, remove it as told by your provider. Put ice in a plastic bag. Place a towel between your skin and the bag. Leave the ice on for 20 minutes, 2-3 times a day. If your skin turns bright red, remove the ice right away to prevent skin damage. The risk of damage is  higher if you cannot feel pain, heat, or cold. Move your toes often to reduce stiffness and swelling. Raise (elevate) your ankle above the level of your heart while you are sitting or lying down. General instructions Take over-the-counter and prescription medicines only as told by your provider. To help you and your provider, write down: How often you have ankle pain. Where the pain is. What the pain feels like. If you are told to wear a certain shoe or insole, make sure you wear it the right way and for as long as you are told. Contact a health care provider if: Your pain gets worse. Your pain does not get better with medicine. You have a fever or chills. You have more trouble walking. You have new symptoms. Your foot, leg, toes, or ankle tingles, becomes numb or swollen, or turns cold and blue. This information is not intended to replace advice given to you by your health care provider. Make sure you discuss any questions you have with your health care provider. Document Revised: 03/12/2022 Document Reviewed: 03/12/2022 Elsevier Patient Education  2024 [employer].

## 2024-06-22 ENCOUNTER — Encounter: Admitting: Emergency Medicine

## 2024-07-05 ENCOUNTER — Ambulatory Visit: Admitting: Emergency Medicine

## 2024-07-05 ENCOUNTER — Encounter: Payer: Self-pay | Admitting: Emergency Medicine

## 2024-07-05 VITALS — BP 124/82 | HR 87 | Temp 98.2°F | Ht 64.0 in | Wt 273.0 lb

## 2024-07-05 DIAGNOSIS — Z13220 Encounter for screening for lipoid disorders: Secondary | ICD-10-CM

## 2024-07-05 DIAGNOSIS — Z0001 Encounter for general adult medical examination with abnormal findings: Secondary | ICD-10-CM

## 2024-07-05 DIAGNOSIS — Z130 Encounter for screening for diseases of the blood and blood-forming organs and certain disorders involving the immune mechanism: Secondary | ICD-10-CM

## 2024-07-05 MED ORDER — WEGOVY 0.25 MG/0.5ML ~~LOC~~ SOAJ: 0.2500 mg | SUBCUTANEOUS | 1 refills | Status: AC

## 2024-07-05 NOTE — Assessment & Plan Note (Signed)
 Diet and nutrition discussed Cardiovascular risks associated with morbid obesity discussed Benefits of exercise discussed Advised to decrease amount of daily carbohydrate intake and daily calories and increase amount of plant based protein in her diet. Will benefit from GLP-1 agonist. Recommend to start Wegovy 

## 2024-07-05 NOTE — Patient Instructions (Signed)
 Health Maintenance, Female Adopting a healthy lifestyle and getting preventive care are important in promoting health and wellness. Ask your health care provider about: The right schedule for you to have regular tests and exams. Things you can do on your own to prevent diseases and keep yourself healthy. What should I know about diet, weight, and exercise? Eat a healthy diet  Eat a diet that includes plenty of vegetables, fruits, low-fat dairy products, and lean protein. Do not eat a lot of foods that are high in solid fats, added sugars, or sodium. Maintain a healthy weight Body mass index (BMI) is used to identify weight problems. It estimates body fat based on height and weight. Your health care provider can help determine your BMI and help you achieve or maintain a healthy weight. Get regular exercise Get regular exercise. This is one of the most important things you can do for your health. Most adults should: Exercise for at least 150 minutes each week. The exercise should increase your heart rate and make you sweat (moderate-intensity exercise). Do strengthening exercises at least twice a week. This is in addition to the moderate-intensity exercise. Spend less time sitting. Even light physical activity can be beneficial. Watch cholesterol and blood lipids Have your blood tested for lipids and cholesterol at 35 years of age, then have this test every 5 years. Have your cholesterol levels checked more often if: Your lipid or cholesterol levels are high. You are older than 35 years of age. You are at high risk for heart disease. What should I know about cancer screening? Depending on your health history and family history, you may need to have cancer screening at various ages. This may include screening for: Breast cancer. Cervical cancer. Colorectal cancer. Skin cancer. Lung cancer. What should I know about heart disease, diabetes, and high blood pressure? Blood pressure and heart  disease High blood pressure causes heart disease and increases the risk of stroke. This is more likely to develop in people who have high blood pressure readings or are overweight. Have your blood pressure checked: Every 3-5 years if you are 32-37 years of age. Every year if you are 71 years old or older. Diabetes Have regular diabetes screenings. This checks your fasting blood sugar level. Have the screening done: Once every three years after age 24 if you are at a normal weight and have a low risk for diabetes. More often and at a younger age if you are overweight or have a high risk for diabetes. What should I know about preventing infection? Hepatitis B If you have a higher risk for hepatitis B, you should be screened for this virus. Talk with your health care provider to find out if you are at risk for hepatitis B infection. Hepatitis C Testing is recommended for: Everyone born from [birth year] through 1966. Anyone with known risk factors for hepatitis C. Sexually transmitted infections (STIs) Get screened for STIs, including gonorrhea and chlamydia, if: You are sexually active and are younger than 35 years of age. You are older than 35 years of age and your health care provider tells you that you are at risk for this type of infection. Your sexual activity has changed since you were last screened, and you are at increased risk for chlamydia or gonorrhea. Ask your health care provider if you are at risk. Ask your health care provider about whether you are at high risk for HIV. Your health care provider may recommend a prescription medicine to help prevent HIV  infection. If you choose to take medicine to prevent HIV, you should first get tested for HIV. You should then be tested every 3 months for as long as you are taking the medicine. Pregnancy If you are about to stop having your period (premenopausal) and you may become pregnant, seek counseling before you get pregnant. Take 400 to 800  micrograms (mcg) of folic acid every day if you become pregnant. Ask for birth control (contraception) if you want to prevent pregnancy. Osteoporosis and menopause Osteoporosis is a disease in which the bones lose minerals and strength with aging. This can result in bone fractures. If you are 42 years old or older, or if you are at risk for osteoporosis and fractures, ask your health care provider if you should: Be screened for bone loss. Take a calcium  or vitamin D  supplement to lower your risk of fractures. Be given hormone replacement therapy (HRT) to treat symptoms of menopause. Follow these instructions at home: Alcohol use Do not drink alcohol if: Your health care provider tells you not to drink. You are pregnant, may be pregnant, or are planning to become pregnant. If you drink alcohol: Limit how much you have to: 0-1 drink a day. Know how much alcohol is in your drink. In the [place] one drink equals one 12 oz bottle of beer (355 mL), one 5 oz glass of wine (148 mL), or one 1 oz glass of hard liquor (44 mL). Lifestyle Do not use any products that contain nicotine or tobacco. These products include cigarettes, chewing tobacco, and vaping devices, such as e-cigarettes. If you need help quitting, ask your health care provider. Do not use street drugs. Do not share needles. Ask your health care provider for help if you need support or information about quitting drugs. General instructions Schedule regular health, dental, and eye exams. Stay current with your vaccines. Tell your health care provider if: You often feel depressed. You have ever been abused or do not feel safe at home. This information is not intended to replace advice given to you by your health care provider. Make sure you discuss any questions you have with your health care provider. Document Revised: 11/25/2023 Document Reviewed: 10/08/2020 Elsevier Patient Education  2025 [employer].

## 2024-07-06 LAB — COMPREHENSIVE METABOLIC PANEL WITH GFR
ALT: 15 U/L (ref 3–35)
AST: 16 U/L (ref 5–37)
Albumin: 3.8 g/dL (ref 3.5–5.2)
Alkaline Phosphatase: 92 U/L (ref 39–117)
BUN: 9 mg/dL (ref 6–23)
CO2: 31 meq/L (ref 19–32)
Calcium: 9.1 mg/dL (ref 8.4–10.5)
Chloride: 101 meq/L (ref 96–112)
Creatinine, Ser: 0.82 mg/dL (ref 0.40–1.20)
GFR: 92.93 mL/min
Glucose, Bld: 117 mg/dL — ABNORMAL HIGH (ref 70–99)
Potassium: 3.8 meq/L (ref 3.5–5.1)
Sodium: 137 meq/L (ref 135–145)
Total Bilirubin: 0.2 mg/dL (ref 0.2–1.2)
Total Protein: 7.7 g/dL (ref 6.0–8.3)

## 2024-07-06 LAB — CBC WITH DIFFERENTIAL/PLATELET
Basophils Absolute: 0 10*3/uL (ref 0.0–0.1)
Basophils Relative: 0.5 % (ref 0.0–3.0)
Eosinophils Absolute: 0.1 10*3/uL (ref 0.0–0.7)
Eosinophils Relative: 2.1 % (ref 0.0–5.0)
HCT: 34.2 % — ABNORMAL LOW (ref 36.0–46.0)
Hemoglobin: 10.7 g/dL — ABNORMAL LOW (ref 12.0–15.0)
Lymphocytes Relative: 38.6 % (ref 12.0–46.0)
Lymphs Abs: 2.7 10*3/uL (ref 0.7–4.0)
MCHC: 31.4 g/dL (ref 30.0–36.0)
MCV: 78.6 fl (ref 78.0–100.0)
Monocytes Absolute: 0.5 10*3/uL (ref 0.1–1.0)
Monocytes Relative: 7.5 % (ref 3.0–12.0)
Neutro Abs: 3.6 10*3/uL (ref 1.4–7.7)
Neutrophils Relative %: 51.3 % (ref 43.0–77.0)
Platelets: 414 10*3/uL — ABNORMAL HIGH (ref 150.0–400.0)
RBC: 4.35 Mil/uL (ref 3.87–5.11)
RDW: 17 % — ABNORMAL HIGH (ref 11.5–15.5)
WBC: 7 10*3/uL (ref 4.0–10.5)

## 2024-07-06 LAB — LIPID PANEL
Cholesterol: 173 mg/dL (ref 28–200)
HDL: 44.9 mg/dL
LDL Cholesterol: 108 mg/dL — ABNORMAL HIGH (ref 10–99)
NonHDL: 127.92
Total CHOL/HDL Ratio: 4
Triglycerides: 102 mg/dL (ref 10.0–149.0)
VLDL: 20.4 mg/dL (ref 0.0–40.0)

## 2024-07-06 LAB — VITAMIN D 25 HYDROXY (VIT D DEFICIENCY, FRACTURES): VITD: 20.09 ng/mL — ABNORMAL LOW (ref 30.00–100.00)

## 2024-07-06 LAB — TSH: TSH: 2.14 u[IU]/mL (ref 0.35–5.50)

## 2024-07-06 LAB — VITAMIN B12: Vitamin B-12: 331 pg/mL (ref 211–911)

## 2024-07-06 LAB — HEMOGLOBIN A1C: Hgb A1c MFr Bld: 5.8 % (ref 4.6–6.5)

## 2024-07-07 ENCOUNTER — Ambulatory Visit: Payer: Self-pay | Admitting: Emergency Medicine

## 2024-07-07 NOTE — Telephone Encounter (Signed)
 Please advise.

## 2024-07-07 NOTE — Telephone Encounter (Signed)
 These are mild abnormalities easily correctable with better nutrition.  Glucose is slightly high but she is not diabetic.  Cholesterol elevation is no real concern.  Chronic anemia most likely related to her menstrual periods.  Iron deficiency is a common cause.  Recommend daily multivitamins with iron.  Regarding GLP-1 agonist cost recommend she calls her pharmacy and medical insurance to find out what other options are more affordable.  She may also want to be referred to nutritional services or medical weight management clinic.
